# Patient Record
Sex: Male | Born: 1980 | Race: Black or African American | Hispanic: No | Smoking: Current some day smoker
Health system: Southern US, Community
[De-identification: ages and names within clinical notes are randomized; demographics above are authoritative.]

## PROBLEM LIST (undated history)

## (undated) ENCOUNTER — Ambulatory Visit (HOSPITAL_COMMUNITY): Admission: EM | Payer: Self-pay

## (undated) ENCOUNTER — Ambulatory Visit (HOSPITAL_COMMUNITY): Admission: EM | Payer: Self-pay | Source: Home / Self Care

## (undated) DIAGNOSIS — M419 Scoliosis, unspecified: Secondary | ICD-10-CM

---

## 2002-08-13 ENCOUNTER — Emergency Department (HOSPITAL_COMMUNITY): Admission: EM | Admit: 2002-08-13 | Discharge: 2002-08-13 | Payer: Self-pay | Admitting: Emergency Medicine

## 2002-08-13 ENCOUNTER — Encounter: Payer: Self-pay | Admitting: Emergency Medicine

## 2002-10-05 ENCOUNTER — Emergency Department (HOSPITAL_COMMUNITY): Admission: EM | Admit: 2002-10-05 | Discharge: 2002-10-05 | Payer: Self-pay | Admitting: Emergency Medicine

## 2002-12-16 ENCOUNTER — Emergency Department (HOSPITAL_COMMUNITY): Admission: EM | Admit: 2002-12-16 | Discharge: 2002-12-16 | Payer: Self-pay | Admitting: Emergency Medicine

## 2004-10-12 ENCOUNTER — Emergency Department (HOSPITAL_COMMUNITY): Admission: EM | Admit: 2004-10-12 | Discharge: 2004-10-13 | Payer: Self-pay | Admitting: Emergency Medicine

## 2005-02-12 ENCOUNTER — Emergency Department (HOSPITAL_COMMUNITY): Admission: EM | Admit: 2005-02-12 | Discharge: 2005-02-12 | Payer: Self-pay | Admitting: Emergency Medicine

## 2005-02-26 ENCOUNTER — Emergency Department (HOSPITAL_COMMUNITY): Admission: EM | Admit: 2005-02-26 | Discharge: 2005-02-26 | Payer: Self-pay | Admitting: Family Medicine

## 2005-09-11 ENCOUNTER — Emergency Department (HOSPITAL_COMMUNITY): Admission: EM | Admit: 2005-09-11 | Discharge: 2005-09-11 | Payer: Self-pay | Admitting: Emergency Medicine

## 2005-10-18 ENCOUNTER — Emergency Department (HOSPITAL_COMMUNITY): Admission: EM | Admit: 2005-10-18 | Discharge: 2005-10-18 | Payer: Self-pay | Admitting: Emergency Medicine

## 2006-07-29 ENCOUNTER — Emergency Department (HOSPITAL_COMMUNITY): Admission: EM | Admit: 2006-07-29 | Discharge: 2006-07-29 | Payer: Self-pay | Admitting: Emergency Medicine

## 2006-11-06 ENCOUNTER — Emergency Department (HOSPITAL_COMMUNITY): Admission: EM | Admit: 2006-11-06 | Discharge: 2006-11-06 | Payer: Self-pay | Admitting: Emergency Medicine

## 2006-11-12 ENCOUNTER — Emergency Department (HOSPITAL_COMMUNITY): Admission: EM | Admit: 2006-11-12 | Discharge: 2006-11-12 | Payer: Self-pay | Admitting: Family Medicine

## 2007-04-30 ENCOUNTER — Emergency Department (HOSPITAL_COMMUNITY): Admission: EM | Admit: 2007-04-30 | Discharge: 2007-04-30 | Payer: Self-pay | Admitting: Emergency Medicine

## 2008-06-20 ENCOUNTER — Emergency Department (HOSPITAL_COMMUNITY): Admission: EM | Admit: 2008-06-20 | Discharge: 2008-06-20 | Payer: Self-pay | Admitting: Family Medicine

## 2008-10-14 ENCOUNTER — Emergency Department (HOSPITAL_COMMUNITY): Admission: EM | Admit: 2008-10-14 | Discharge: 2008-10-14 | Payer: Self-pay | Admitting: Emergency Medicine

## 2010-07-22 LAB — CULTURE, ROUTINE-ABSCESS

## 2010-12-22 ENCOUNTER — Inpatient Hospital Stay (HOSPITAL_COMMUNITY)
Admission: RE | Admit: 2010-12-22 | Discharge: 2010-12-22 | Disposition: A | Discharge status: Home / Self Care | Payer: Self-pay | Source: Ambulatory Visit | Attending: Family Medicine | Admitting: Family Medicine

## 2010-12-31 LAB — URINALYSIS, ROUTINE W REFLEX MICROSCOPIC
Leukocytes, UA: NEGATIVE
Nitrite: NEGATIVE
Protein, ur: 30 — AB
Specific Gravity, Urine: 1.028
Urobilinogen, UA: 0.2

## 2010-12-31 LAB — BASIC METABOLIC PANEL
BUN: 11
CO2: 21
Calcium: 10.5
Creatinine, Ser: 1.17
GFR calc non Af Amer: >60
Glucose, Bld: 124 — ABNORMAL HIGH

## 2010-12-31 LAB — URINE MICROSCOPIC-ADD ON

## 2010-12-31 LAB — CBC
Hemoglobin: 16.9
MCHC: 34.2
Platelets: 265
RDW: 12.5

## 2010-12-31 LAB — DIFFERENTIAL
Basophils Absolute: 0
Basophils Relative: 0
Monocytes Absolute: 0.4
Neutro Abs: 7.7
Neutrophils Relative %: 92 — ABNORMAL HIGH

## 2011-11-19 ENCOUNTER — Emergency Department (HOSPITAL_COMMUNITY): Payer: Self-pay

## 2011-11-19 ENCOUNTER — Encounter (HOSPITAL_COMMUNITY): Payer: Self-pay

## 2011-11-19 ENCOUNTER — Emergency Department (HOSPITAL_COMMUNITY)
Admission: EM | Admit: 2011-11-19 | Discharge: 2011-11-19 | Disposition: A | Discharge status: Home / Self Care | Payer: Self-pay | Attending: Emergency Medicine | Admitting: Emergency Medicine

## 2011-11-19 DIAGNOSIS — S0280XA Fracture of other specified skull and facial bones, unspecified side, initial encounter for closed fracture: Secondary | ICD-10-CM | POA: Insufficient documentation

## 2011-11-19 DIAGNOSIS — Z23 Encounter for immunization: Secondary | ICD-10-CM | POA: Insufficient documentation

## 2011-11-19 DIAGNOSIS — F10929 Alcohol use, unspecified with intoxication, unspecified: Secondary | ICD-10-CM

## 2011-11-19 DIAGNOSIS — S199XXA Unspecified injury of neck, initial encounter: Secondary | ICD-10-CM | POA: Insufficient documentation

## 2011-11-19 DIAGNOSIS — S01119A Laceration without foreign body of unspecified eyelid and periocular area, initial encounter: Secondary | ICD-10-CM | POA: Insufficient documentation

## 2011-11-19 DIAGNOSIS — S02839A Fracture of medial orbital wall, unspecified side, initial encounter for closed fracture: Secondary | ICD-10-CM

## 2011-11-19 DIAGNOSIS — F1010 Alcohol abuse, uncomplicated: Secondary | ICD-10-CM | POA: Insufficient documentation

## 2011-11-19 DIAGNOSIS — S0993XA Unspecified injury of face, initial encounter: Secondary | ICD-10-CM | POA: Insufficient documentation

## 2011-11-19 DIAGNOSIS — M542 Cervicalgia: Secondary | ICD-10-CM | POA: Insufficient documentation

## 2011-11-19 LAB — CBC
Hemoglobin: 14.2 g/dL (ref 13.0–17.0)
MCH: 32.8 pg (ref 26.0–34.0)
MCHC: 34.1 g/dL (ref 30.0–36.0)
MCV: 96.3 fL (ref 78.0–100.0)
Platelets: 145 10*3/uL — ABNORMAL LOW (ref 150–400)
RBC: 4.33 MIL/uL (ref 4.22–5.81)

## 2011-11-19 LAB — COMPREHENSIVE METABOLIC PANEL
ALT: 106 U/L — ABNORMAL HIGH (ref 0–53)
AST: 120 U/L — ABNORMAL HIGH (ref 0–37)
Albumin: 4.4 g/dL (ref 3.5–5.2)
Chloride: 102 mEq/L (ref 96–112)
Creatinine, Ser: 0.76 mg/dL (ref 0.50–1.35)
Sodium: 138 mEq/L (ref 135–145)
Total Bilirubin: 0.2 mg/dL — ABNORMAL LOW (ref 0.3–1.2)

## 2011-11-19 LAB — SAMPLE TO BLOOD BANK

## 2011-11-19 LAB — POCT I-STAT, CHEM 8
Calcium, Ion: 1.06 mmol/L — ABNORMAL LOW (ref 1.12–1.23)
Glucose, Bld: 104 mg/dL — ABNORMAL HIGH (ref 70–99)
HCT: 47 % (ref 39.0–52.0)
Hemoglobin: 16 g/dL (ref 13.0–17.0)
TCO2: 21 mmol/L (ref 0–100)

## 2011-11-19 LAB — URINALYSIS, MICROSCOPIC ONLY
Bilirubin Urine: NEGATIVE
Glucose, UA: NEGATIVE mg/dL
Ketones, ur: NEGATIVE mg/dL
pH: 5.5 (ref 5.0–8.0)

## 2011-11-19 LAB — PROTIME-INR: INR: 1.04 (ref 0.00–1.49)

## 2011-11-19 LAB — LACTIC ACID, PLASMA: Lactic Acid, Venous: 1.5 mmol/L (ref 0.5–2.2)

## 2011-11-19 MED ORDER — IOHEXOL 300 MG/ML  SOLN
100.0000 mL | Freq: Once | INTRAMUSCULAR | Status: AC | PRN
  Administered 2011-11-19: 100 mL via INTRAVENOUS

## 2011-11-19 MED ORDER — CEPHALEXIN 500 MG PO CAPS: 500.0000 mg | ORAL_CAPSULE | Freq: Four times a day (QID) | ORAL | Status: AC

## 2011-11-19 MED ORDER — FENTANYL CITRATE 0.05 MG/ML IJ SOLN
100.0000 ug | Freq: Once | INTRAMUSCULAR | Status: AC
  Administered 2011-11-19: 100 ug via INTRAVENOUS
  Filled 2011-11-19: qty 2

## 2011-11-19 MED ORDER — ATROPINE SULFATE 1 % OP OINT
1.0000 "application " | TOPICAL_OINTMENT | OPHTHALMIC | Status: AC
  Administered 2011-11-19: 1 via OPHTHALMIC
  Filled 2011-11-19: qty 3.5

## 2011-11-19 MED ORDER — TOBRAMYCIN-DEXAMETHASONE 0.3-0.1 % OP OINT
TOPICAL_OINTMENT | Freq: Once | OPHTHALMIC | Status: AC
  Administered 2011-11-19: 12:00:00 via OPHTHALMIC
  Filled 2011-11-19: qty 3.5

## 2011-11-19 MED ORDER — IBUPROFEN 600 MG PO TABS: 600.0000 mg | ORAL_TABLET | Freq: Four times a day (QID) | ORAL | Status: AC | PRN

## 2011-11-19 MED ORDER — DEXAMETHASONE SODIUM PHOSPHATE 10 MG/ML IJ SOLN: 10.0000 mg | Freq: Once | INTRAMUSCULAR | Status: DC

## 2011-11-19 MED ORDER — PREDNISONE 10 MG PO TABS: 20.0000 mg | ORAL_TABLET | Freq: Every day | ORAL | Status: DC

## 2011-11-19 MED ORDER — ONDANSETRON HCL 4 MG/2ML IJ SOLN
4.0000 mg | Freq: Once | INTRAMUSCULAR | Status: AC
  Administered 2011-11-19: 4 mg via INTRAVENOUS
  Filled 2011-11-19: qty 2

## 2011-11-19 MED ORDER — FENTANYL CITRATE 0.05 MG/ML IJ SOLN
50.0000 ug | Freq: Once | INTRAMUSCULAR | Status: AC
  Administered 2011-11-19: 50 ug via INTRAVENOUS

## 2011-11-19 MED ORDER — FLUORESCEIN SODIUM 1 MG OP STRP
ORAL_STRIP | OPHTHALMIC | Status: AC
  Filled 2011-11-19: qty 2

## 2011-11-19 MED ORDER — FENTANYL CITRATE 0.05 MG/ML IJ SOLN
INTRAMUSCULAR | Status: AC
  Administered 2011-11-19: 50 ug via INTRAVENOUS
  Filled 2011-11-19: qty 2

## 2011-11-19 MED ORDER — TETRACAINE HCL 0.5 % OP SOLN
OPHTHALMIC | Status: AC
  Filled 2011-11-19: qty 2

## 2011-11-19 MED ORDER — HYDROCODONE-ACETAMINOPHEN 5-325 MG PO TABS: 1.0000 | ORAL_TABLET | Freq: Three times a day (TID) | ORAL | Status: AC | PRN

## 2011-11-19 MED ORDER — TETANUS-DIPHTH-ACELL PERTUSSIS 5-2.5-18.5 LF-MCG/0.5 IM SUSP
0.5000 mL | Freq: Once | INTRAMUSCULAR | Status: AC
  Administered 2011-11-19: 0.5 mL via INTRAMUSCULAR
  Filled 2011-11-19: qty 0.5

## 2011-11-19 MED ORDER — CEPHALEXIN 250 MG PO CAPS: 500.0000 mg | ORAL_CAPSULE | Freq: Once | ORAL | Status: DC
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
## 2011-11-19 NOTE — ED Notes (Signed)
Pt resting quietly with eyes closed, no s/s of any pain or distress noted. C-Collar remains in place, gauze dressing over left eye due to mild bleeding. Pt INAD, resp e/u and skin w/d, pt has mild increase of swelling and bruising to left eye, will continue to monitor pt.

## 2011-11-19 NOTE — ED Notes (Signed)
Patient currently talking with a Child psychotherapist about discharge.
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
## 2011-12-07 ENCOUNTER — Emergency Department (HOSPITAL_COMMUNITY): Payer: No Typology Code available for payment source

## 2011-12-07 ENCOUNTER — Emergency Department (HOSPITAL_COMMUNITY)
Admission: EM | Admit: 2011-12-07 | Discharge: 2011-12-07 | Disposition: A | Discharge status: Home / Self Care | Payer: No Typology Code available for payment source | Attending: Emergency Medicine | Admitting: Emergency Medicine

## 2011-12-07 DIAGNOSIS — S161XXA Strain of muscle, fascia and tendon at neck level, initial encounter: Secondary | ICD-10-CM

## 2011-12-07 DIAGNOSIS — R071 Chest pain on breathing: Secondary | ICD-10-CM | POA: Insufficient documentation

## 2011-12-07 DIAGNOSIS — R0789 Other chest pain: Secondary | ICD-10-CM

## 2011-12-07 DIAGNOSIS — R51 Headache: Secondary | ICD-10-CM | POA: Insufficient documentation

## 2011-12-07 DIAGNOSIS — Y92410 Unspecified street and highway as the place of occurrence of the external cause: Secondary | ICD-10-CM | POA: Insufficient documentation

## 2011-12-07 DIAGNOSIS — M542 Cervicalgia: Secondary | ICD-10-CM | POA: Insufficient documentation

## 2011-12-07 DIAGNOSIS — S139XXA Sprain of joints and ligaments of unspecified parts of neck, initial encounter: Secondary | ICD-10-CM | POA: Insufficient documentation

## 2011-12-07 MED ORDER — OXYCODONE-ACETAMINOPHEN 5-325 MG PO TABS
1.0000 | ORAL_TABLET | Freq: Once | ORAL | Status: AC
  Administered 2011-12-07: 1 via ORAL
  Filled 2011-12-07: qty 1

## 2011-12-07 MED ORDER — HYDROCODONE-ACETAMINOPHEN 5-325 MG PO TABS: 1.0000 | ORAL_TABLET | ORAL | Status: AC | PRN
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
## 2012-09-14 ENCOUNTER — Emergency Department (HOSPITAL_COMMUNITY)
Admission: EM | Admit: 2012-09-14 | Discharge: 2012-09-14 | Disposition: A | Discharge status: Home / Self Care | Payer: No Typology Code available for payment source | Attending: Emergency Medicine | Admitting: Emergency Medicine

## 2012-09-14 ENCOUNTER — Encounter (HOSPITAL_COMMUNITY): Payer: Self-pay | Admitting: *Deleted

## 2012-09-14 DIAGNOSIS — S40811A Abrasion of right upper arm, initial encounter: Secondary | ICD-10-CM

## 2012-09-14 DIAGNOSIS — S40812A Abrasion of left upper arm, initial encounter: Secondary | ICD-10-CM

## 2012-09-14 DIAGNOSIS — Y92410 Unspecified street and highway as the place of occurrence of the external cause: Secondary | ICD-10-CM | POA: Insufficient documentation

## 2012-09-14 DIAGNOSIS — IMO0002 Reserved for concepts with insufficient information to code with codable children: Secondary | ICD-10-CM | POA: Insufficient documentation

## 2012-09-14 DIAGNOSIS — Z8739 Personal history of other diseases of the musculoskeletal system and connective tissue: Secondary | ICD-10-CM | POA: Insufficient documentation

## 2012-09-14 DIAGNOSIS — Y9389 Activity, other specified: Secondary | ICD-10-CM | POA: Insufficient documentation

## 2012-09-14 HISTORY — DX: Scoliosis, unspecified: M41.9

## 2012-09-14 MED ORDER — IBUPROFEN 800 MG PO TABS: 800.0000 mg | ORAL_TABLET | Freq: Three times a day (TID) | ORAL | Status: DC
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
## 2012-09-15 NOTE — ED Provider Notes (Signed)
Medical screening examination/treatment/procedure(s) were performed by non-physician practitioner and as supervising physician I was immediately available for consultation/collaboration.    Vida Roller, MD 09/15/12 [phone]
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
## 2013-06-07 ENCOUNTER — Emergency Department (INDEPENDENT_AMBULATORY_CARE_PROVIDER_SITE_OTHER): Payer: Self-pay

## 2013-06-07 ENCOUNTER — Emergency Department (INDEPENDENT_AMBULATORY_CARE_PROVIDER_SITE_OTHER)
Admission: EM | Admit: 2013-06-07 | Discharge: 2013-06-07 | Disposition: A | Discharge status: Home / Self Care | Payer: Self-pay | Source: Home / Self Care | Attending: Family Medicine | Admitting: Family Medicine

## 2013-06-07 ENCOUNTER — Encounter (HOSPITAL_COMMUNITY): Payer: Self-pay | Admitting: Emergency Medicine

## 2013-06-07 DIAGNOSIS — W19XXXA Unspecified fall, initial encounter: Secondary | ICD-10-CM

## 2013-06-07 DIAGNOSIS — T148XXA Other injury of unspecified body region, initial encounter: Secondary | ICD-10-CM

## 2013-06-07 MED ORDER — DICLOFENAC SODIUM 75 MG PO TBEC: 75.0000 mg | DELAYED_RELEASE_TABLET | Freq: Two times a day (BID) | ORAL | Status: DC | PRN

## 2013-06-07 NOTE — ED Notes (Signed)
Reports slipping on black ice.  States later in the day fell forward down flight of stairs at apartment. And hitting head.   C/o severe right sided body and head pain.  Unsure if loss of consciousness.  States slept 24 hour period after incident.  Having mild sob.  Symptoms present x 3 days.   No relief with otc pain meds.

## 2013-06-07 NOTE — ED Provider Notes (Signed)
Kent BannisterBruno J Graham is a 33 y.o. male who presents to Urgent Care today for fall.  Patient had several falls in the past 2 days. Starting Wednesday he slipped on black ice landing on his right knee. He additionally tripped and fell down some stairs yesterday. He has pain in his right a.c. joint, right lateral ribs, and right lateral anterior knee. He notes pain with walking and knee bending. Additionally he notes his pain is worse with deep inspiration   Past Medical History  Diagnosis Date  . Scoliosis    History  Substance Use Topics  . Smoking status: Current Every Day Smoker -- 0.50 packs/day    Types: Cigarettes  . Smokeless tobacco: Not on file  . Alcohol Use: No   ROS as above Medications: No current facility-administered medications for this encounter.   Current Outpatient Prescriptions  Medication Sig Dispense Refill  . ibuprofen (ADVIL,MOTRIN) 800 MG tablet Take 1 tablet (800 mg total) by mouth 3 (three) times daily.  21 tablet  0    Exam:  BP 141/74  Pulse 78  Temp(Src) 98.1 F (36.7 C) (Oral)  Resp 20  SpO2 100% Gen: Well NAD HEENT: EOMI,  MMM Lungs: Normal work of breathing. CTABL Heart: RRR no MRG Abd: NABS, Soft. NT, ND Exts: Brisk capillary refill, warm and well perfused.  Right shoulder: Normal-appearing Painful but full range of motion. Tender palpation a.c. joint. Lateral ribs: Tender on right normal left Knee: Right knee normal-appearing full range of motion tender palpation anterior proximal tibia.  Stable ligamentous exam Normal range of motion.    Assessment and Plan: 33 y.o. male with fall with multiple contusions. Patient likely has a grade 1 a.c. shoulder separation as well as rib contusion and knee contusion. Plan to treat with diclofenac ice relative rest. Return in 2 weeks if not improved. Work note provided  Discussed warning signs or symptoms. Please see discharge instructions. Patient expresses understanding.    Rodolph BongEvan S Jazzmine Kleiman,  MD 06/07/13 [phone]

## 2013-06-07 NOTE — Discharge Instructions (Signed)
Thank you for coming in today. Take diclofenac twice daily as needed for severe pain. Call or go to the emergency room if you get worse, have trouble breathing, have chest pains, or palpitations.   Rib Contusion A rib contusion (bruise) can occur by a blow to the chest or by a fall against a hard object. Usually these will be much better in a couple weeks. If X-rays were taken today and there are no broken bones (fractures), the diagnosis of bruising is made. However, broken ribs may not show up for several days, or may be discovered later on a routine X-ray when signs of healing show up. If this happens to you, it does not mean that something was missed on the X-ray, but simply that it did not show up on the first X-rays. Earlier diagnosis will not usually change the treatment. HOME CARE INSTRUCTIONS   Avoid strenuous activity. Be careful during activities and avoid bumping the injured ribs. Activities that pull on the injured ribs and cause pain should be avoided, if possible.  For the first day or two, an ice pack used every 20 minutes while awake may be helpful. Put ice in a plastic bag and put a towel between the bag and the skin.  Eat a normal, well-balanced diet. Drink plenty of fluids to avoid constipation.  Take deep breaths several times a day to keep lungs free of infection. Try to cough several times a day. Splint the injured area with a pillow while coughing to ease pain. Coughing can help prevent pneumonia.  Wear a rib belt or binder only if told to do so by your caregiver. If you are wearing a rib belt or binder, you must do the breathing exercises as directed by your caregiver. If not used properly, rib belts or binders restrict breathing which can lead to pneumonia.  Only take over-the-counter or prescription medicines for pain, discomfort, or fever as directed by your caregiver. SEEK MEDICAL CARE IF:   You or your child has an oral temperature above 102 F (38.9 C).  Your  baby is older than 3 months with a rectal temperature of 100.5 F (38.1 C) or higher for more than 1 day.  You develop a cough, with thick or bloody sputum. SEEK IMMEDIATE MEDICAL CARE IF:   You have difficulty breathing.  You feel sick to your stomach (nausea), have vomiting or belly (abdominal) pain.  You have worsening pain, not controlled with medications, or there is a change in the location of the pain.  You develop sweating or radiation of the pain into the arms, jaw or shoulders, or become light headed or faint.  You or your child has an oral temperature above 102 F (38.9 C), not controlled by medicine.  Your or your baby is older than 3 months with a rectal temperature of 102 F (38.9 C) or higher.  Your baby is 87 months old or younger with a rectal temperature of 100.4 F (38 C) or higher. MAKE SURE YOU:   Understand these instructions.  Will watch your condition.  Will get help right away if you are not doing well or get worse. Document Released: 12/21/2000 Document Revised: 07/23/2012 Document Reviewed: 11/14/2007 [facility] Patient Information 2014 [place], [place].  Shoulder Separation You have a shoulder separation (acromio-clavicular separation). This occurs when an injury stretches or tears the ligaments that connect the top of the shoulder blade to the collar bone. Injury causes these bones to separate. A sling or splint may be  applied to limit your range of motion. HOME CARE INSTRUCTIONS   Apply ice to the injury for 15-20 minutes each hour while awake for the first 2 days. Put the ice in a plastic bag. Place a towel between the bag of ice and your skin.  Avoid activities that increase the pain.  Wear your sling or splint for as long as directed by your caregiver. If you remove the sling to dress or bathe, be sure your arm remains in the same position as when the sling is on. Do not lift your arm.  The splint must be tightened by another person every day.  Tighten it enough to keep the shoulders held back. Allow enough room to place the index finger between the body and strap. Loosen the splint immediately if you feel numbness or tingling in your hands.  Only take over-the-counter or prescription medicines for pain, discomfort, or fever as directed by your caregiver.  If this is a severe injury, you may need a referral to a specialist. It is very important to keep any follow-up appointments in order to avoid any type of permanent shoulder disability or chronic pain problems. SEEK MEDICAL CARE IF:  Pain or swelling to the injury increases and is not relieved with medications. SEEK IMMEDIATE MEDICAL CARE IF:  Your arm becomes numb, pale, cold, or there are abnormal feelings (sensations) shooting into your fingertips. Document Released: 01/05/2005 Document Revised: 06/20/2011 Document Reviewed: 11/07/2006 [facility] Patient Information 2014 [place].

## 2013-10-23 ENCOUNTER — Emergency Department (INDEPENDENT_AMBULATORY_CARE_PROVIDER_SITE_OTHER)
Admission: EM | Admit: 2013-10-23 | Discharge: 2013-10-23 | Disposition: A | Discharge status: Home / Self Care | Payer: Self-pay | Source: Home / Self Care | Attending: Family Medicine | Admitting: Family Medicine

## 2013-10-23 ENCOUNTER — Encounter (HOSPITAL_COMMUNITY): Payer: Self-pay | Admitting: Emergency Medicine

## 2013-10-23 DIAGNOSIS — M5431 Sciatica, right side: Secondary | ICD-10-CM

## 2013-10-23 DIAGNOSIS — M5430 Sciatica, unspecified side: Secondary | ICD-10-CM

## 2013-10-23 MED ORDER — KETOROLAC TROMETHAMINE 30 MG/ML IJ SOLN
INTRAMUSCULAR | Status: AC
  Filled 2013-10-23: qty 1

## 2013-10-23 MED ORDER — KETOROLAC TROMETHAMINE 30 MG/ML IJ SOLN
30.0000 mg | Freq: Once | INTRAMUSCULAR | Status: AC
  Administered 2013-10-23: 30 mg via INTRAMUSCULAR

## 2013-10-23 MED ORDER — INDOMETHACIN 25 MG PO CAPS: 25.0000 mg | ORAL_CAPSULE | Freq: Three times a day (TID) | ORAL | Status: DC | PRN

## 2013-10-23 NOTE — ED Provider Notes (Signed)
CSN: [id number]     Arrival date & time 10/23/13  1217 History   First MD Initiated Contact with Patient 10/23/13 1228     Chief Complaint  Patient presents with  . Back Pain   (Consider location/radiation/quality/duration/timing/severity/associated sxs/prior Treatment) HPI Comments: Denies recent injury Is a smoker No PCP Works as Location managermachine operator at [employer]  Patient is a 33 y.o. male presenting with back pain. The history is provided by the patient.  Back Pain Location:  Sacro-iliac joint Quality:  Aching and shooting Radiates to:  R thigh Pain severity:  Moderate Onset quality:  Gradual Duration:  3 weeks Chronicity:  New Ineffective treatments: reports limited relief with occasional use of ibuprofen. Associated symptoms: leg pain   Associated symptoms: no abdominal pain, no abdominal swelling, no bladder incontinence, no bowel incontinence, no chest pain, no dysuria, no fever, no headaches, no numbness, no paresthesias, no pelvic pain, no perianal numbness, no tingling, no weakness and no weight loss     Past Medical History  Diagnosis Date  . Scoliosis    History reviewed. No pertinent past surgical history. History reviewed. No pertinent family history. History  Substance Use Topics  . Smoking status: Current Every Day Smoker -- 0.50 packs/day    Types: Cigarettes  . Smokeless tobacco: Not on file  . Alcohol Use: No    Review of Systems  Constitutional: Negative for fever and weight loss.  Cardiovascular: Negative for chest pain.  Gastrointestinal: Negative for abdominal pain and bowel incontinence.  Genitourinary: Negative for bladder incontinence, dysuria and pelvic pain.  Musculoskeletal: Positive for back pain.  Neurological: Negative for tingling, weakness, numbness, headaches and paresthesias.  All other systems reviewed and are negative.   Allergies  Peanut butter flavor  Home Medications   Prior to Admission medications     Medication Sig Start Date End Date Taking? Authorizing Provider  diclofenac (VOLTAREN) 75 MG EC tablet Take 1 tablet (75 mg total) by mouth 2 (two) times daily as needed. 06/07/13   Rodolph BongEvan S Corey, MD  ibuprofen (ADVIL,MOTRIN) 800 MG tablet Take 1 tablet (800 mg total) by mouth 3 (three) times daily. 09/14/12   Junius FinnerErin O'Malley, PA-C  indomethacin (INDOCIN) 25 MG capsule Take 1 capsule (25 mg total) by mouth 3 (three) times daily as needed for mild pain or moderate pain. 10/23/13   Jess BartersJennifer Lee Presson, PA   BP 123/71  Resp 16  SpO2 100% Physical Exam  Nursing note and vitals reviewed. Constitutional: He is oriented to person, place, and time. He appears well-developed and well-nourished. No distress.  HENT:  Head: Normocephalic and atraumatic.  Eyes: Conjunctivae are normal. No scleral icterus.  Neck: Normal range of motion. Neck supple.  Cardiovascular: Normal rate, regular rhythm and normal heart sounds.   Pulmonary/Chest: Effort normal and breath sounds normal.  Musculoskeletal: Normal range of motion.       Lumbar back: He exhibits tenderness. He exhibits normal range of motion, no bony tenderness, no swelling, no edema, no deformity, no laceration, no spasm and normal pulse.       Back:  CSM exam of right and left lower extremities without deficit. Patellar DTRs 2+ bilaterally.   Neurological: He is alert and oriented to person, place, and time.  Skin: Skin is warm and dry. No rash noted. No erythema.  Psychiatric: He has a normal mood and affect. His behavior is normal.    ED Course  Procedures (including critical care time) Labs Review Labs Reviewed - No  data to display  Imaging Review No results found.   MDM   1. Sciatica neuralgia, right   No evidence of cauda equina syndrome and exam consistent with right sided sciatica. No associated GI or GU symptoms. Will treat with indocin as prescribed and advise follow up if no improvement.     Jess Barters [place], [place] 10/23/13  1330

## 2013-10-23 NOTE — Discharge Instructions (Signed)
Sciatica Sciatica is pain, weakness, numbness, or tingling along the path of the sciatic nerve. The nerve starts in the lower back and runs down the back of each leg. The nerve controls the muscles in the lower leg and in the back of the knee, while also providing sensation to the back of the thigh, lower leg, and the sole of your foot. Sciatica is a symptom of another medical condition. For instance, nerve damage or certain conditions, such as a herniated disk or bone spur on the spine, pinch or put pressure on the sciatic nerve. This causes the pain, weakness, or other sensations normally associated with sciatica. Generally, sciatica only affects one side of the body. CAUSES   Herniated or slipped disc.  Degenerative disk disease.  A pain disorder involving the narrow muscle in the buttocks (piriformis syndrome).  Pelvic injury or fracture.  Pregnancy.  Tumor (rare). SYMPTOMS  Symptoms can vary from mild to very severe. The symptoms usually travel from the low back to the buttocks and down the back of the leg. Symptoms can include:  Mild tingling or dull aches in the lower back, leg, or hip.  Numbness in the back of the calf or sole of the foot.  Burning sensations in the lower back, leg, or hip.  Sharp pains in the lower back, leg, or hip.  Leg weakness.  Severe back pain inhibiting movement. These symptoms may get worse with coughing, sneezing, laughing, or prolonged sitting or standing. Also, being overweight may worsen symptoms. DIAGNOSIS  Your caregiver will perform a physical exam to look for common symptoms of sciatica. He or she may ask you to do certain movements or activities that would trigger sciatic nerve pain. Other tests may be performed to find the cause of the sciatica. These may include:  Blood tests.  X-rays.  Imaging tests, such as an MRI or CT scan. TREATMENT  Treatment is directed at the cause of the sciatic pain. Sometimes, treatment is not necessary  and the pain and discomfort goes away on its own. If treatment is needed, your caregiver may suggest:  Over-the-counter medicines to relieve pain.  Prescription medicines, such as anti-inflammatory medicine, muscle relaxants, or narcotics.  Applying heat or ice to the painful area.  Steroid injections to lessen pain, irritation, and inflammation around the nerve.  Reducing activity during periods of pain.  Exercising and stretching to strengthen your abdomen and improve flexibility of your spine. Your caregiver may suggest losing weight if the extra weight makes the back pain worse.  Physical therapy.  Surgery to eliminate what is pressing or pinching the nerve, such as a bone spur or part of a herniated disk. HOME CARE INSTRUCTIONS   Only take over-the-counter or prescription medicines for pain or discomfort as directed by your caregiver.  Apply ice to the affected area for 20 minutes, 3-4 times a day for the first 48-72 hours. Then try heat in the same way.  Exercise, stretch, or perform your usual activities if these do not aggravate your pain.  Attend physical therapy sessions as directed by your caregiver.  Keep all follow-up appointments as directed by your caregiver.  Do not wear high heels or shoes that do not provide proper support.  Check your mattress to see if it is too soft. A firm mattress may lessen your pain and discomfort. SEEK IMMEDIATE MEDICAL CARE IF:   You lose control of your bowel or bladder (incontinence).  You have increasing weakness in the lower back, pelvis, buttocks,   or legs.  You have redness or swelling of your back.  You have a burning sensation when you urinate.  You have pain that gets worse when you lie down or awakens you at night.  Your pain is worse than you have experienced in the past.  Your pain is lasting longer than 4 weeks.  You are suddenly losing weight without reason. MAKE SURE YOU:  Understand these  instructions.  Will watch your condition.  Will get help right away if you are not doing well or get worse. Document Released: 03/22/2001 Document Revised: 09/27/2011 Document Reviewed: 08/07/2011 [employer] Patient Information 2015 [employer]. This information is not intended to replace advice given to you by your health care provider. Make sure you discuss any questions you have with your health care provider.  

## 2013-10-23 NOTE — ED Notes (Signed)
C/o 3 week duration of pain in low back, "pain is past 10"; NAD w/d/color good, calm conversant. Denies saddle anesthesia or loss of function

## 2013-10-24 NOTE — ED Provider Notes (Signed)
Medical screening examination/treatment/procedure(s) were performed by a resident physician or non-physician practitioner and as the supervising physician I was immediately available for consultation/collaboration.  Jerah Esty, MD    Eriverto Byrnes S Cari Vandeberg, MD 10/24/13 0742 

## 2018-12-25 ENCOUNTER — Ambulatory Visit (HOSPITAL_COMMUNITY)
Admission: EM | Admit: 2018-12-25 | Discharge: 2018-12-25 | Disposition: A | Discharge status: Home / Self Care | Payer: Self-pay | Attending: Emergency Medicine | Admitting: Emergency Medicine

## 2018-12-25 ENCOUNTER — Other Ambulatory Visit: Payer: Self-pay

## 2018-12-25 ENCOUNTER — Encounter (HOSPITAL_COMMUNITY): Payer: Self-pay | Admitting: Emergency Medicine

## 2018-12-25 DIAGNOSIS — Z20828 Contact with and (suspected) exposure to other viral communicable diseases: Secondary | ICD-10-CM | POA: Insufficient documentation

## 2018-12-25 DIAGNOSIS — R197 Diarrhea, unspecified: Secondary | ICD-10-CM

## 2018-12-25 DIAGNOSIS — F17210 Nicotine dependence, cigarettes, uncomplicated: Secondary | ICD-10-CM | POA: Insufficient documentation

## 2018-12-25 DIAGNOSIS — M545 Low back pain: Secondary | ICD-10-CM | POA: Insufficient documentation

## 2018-12-25 MED ORDER — IBUPROFEN 600 MG PO TABS: 600.0000 mg | ORAL_TABLET | Freq: Four times a day (QID) | ORAL | 0 refills | Status: DC | PRN

## 2018-12-25 MED ORDER — ONDANSETRON 8 MG PO TBDP: ORAL_TABLET | ORAL | 0 refills | Status: DC

## 2018-12-25 NOTE — Discharge Instructions (Signed)
Try the Zofran, and if that does not work, Imodium.  Push electrolyte containing fluids such as Pedialyte and Gatorade.  Your COVID test should be back in 1 to 2 days.  Come back here in several days if you are not better with the Zofran and Imodium, we can consider testing of a stool sample for viruses and bacteria.  Go immediately to the emergency department for fevers above 100.4, abdominal pain that changes or gets worse, back pain that gets worse, if you are unable to keep anything down, or for other concerns.  Follow-up with a primary care physician as soon as you possibly can for routine care.  Below is a list of primary care practices who are taking new patients for you to follow-up with.  [facility] at [facility] [address] [place], [place] [address] [phone]  [place], [place] [address] [phone]  [facility] [phone] [place] [address]  [facility]: [place] [employer]  [phone]  [place] [facility]: [place]   [phone]  [facility]: [address] [place]  [phone]  [place] [facility] : [address] [place] [phone]  [facility]: [address] [place] [address] [phone]  [facility]: [place] [phone]  Dr. Blanchie Serve [place]  [phone]  Dr. Benito Mccreedy, [facility]. [place], [place] [address]  [phone]  Go to www.goodrx.com to look up your medications. This will give  you a list of where you can find your prescriptions at the most affordable prices. Or ask the pharmacist what the cash price is, or if they have any other discount programs available to help make your medication more affordable. This can be less expensive than what you would pay with insurance.

## 2018-12-25 NOTE — ED Provider Notes (Signed)
HPI  SUBJECTIVE:  Kent Graham is a 38 y.o. male who presents with 3 days of multiple episodes of watery, nonbloody diarrhea.  He reports periumbilical abdominal pain and low back pain prior to having a bowel movement, both of which resolve after defecating.  He does not have abdominal or back pain at any other time.  The abdominal pain does not radiate, or migrate.  States that abdominal pain lasts anywhere from 3 to 5 minutes.  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  States that he tested negative several weeks ago.  He states that his symptoms started several hours after eating at church's fried chicken.  He states that the diarrhea is starting to slow down.  No raw or undercooked foods, questionable leftovers, recent travel, camping.  No antibiotics recently.  No contacts with diarrhea.  No abdominal distention, change in urine output, urinary complaints.  He has tried ginger ale, crackers, fasting.  Symptoms are better with fasting, having a bowel movement, worse with p.o. intake and before bowel movement.  He reports occasional alcohol use, denies heavy drinking.  No excess NSAID use.  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  No history of gallbladder disease, pancreatitis, diabetes, hypertension, diverticulitis, diverticulosis, abdominal surgeries, C. difficile infection.  PMD: None.    Past Medical History:  Diagnosis Date  . Scoliosis     History reviewed. No pertinent surgical history.  History reviewed. No pertinent family history.  Social History   Tobacco Use  . Smoking status: Current Every Day Smoker    Packs/day: 0.50    Types: Cigarettes  Substance Use Topics  . Alcohol use: No  . Drug use: No    No current  facility-administered medications for this encounter.   Current Outpatient Medications:  .  ibuprofen (ADVIL) 600 MG tablet, Take 1 tablet (600 mg total) by mouth every 6 (six) hours as needed., Disp: 30 tablet, Rfl: 0 .  ondansetron (ZOFRAN ODT) 8 MG disintegrating tablet, 1/2- 1 tablet q 8 hr prn nausea, vomiting, Disp: 20 tablet, Rfl: 0  Allergies  Allergen Reactions  . Peanut Butter Flavor      ROS  As noted in HPI.   Physical Exam  BP 115/81 (BP Location: Left Arm)   Pulse 90   Temp 98.1 F (36.7 C) (Oral)   Resp 18   SpO2 99%   Constitutional: Well developed, well nourished, no acute distress Eyes:  EOMI, conjunctiva normal bilaterally HENT: Normocephalic, atraumatic,mucus membranes moist Respiratory: Normal inspiratory effort Cardiovascular: Normal rate, regular rhythm, no murmurs rubs or gallops.  Cap refill less than 2 seconds. GI: Normal appearance, soft, nontender, nondistended, active bowel sounds.  No rebound, guarding.  No palpable masses. Back: No CVAT.  No L-spine tenderness.  Positive bilateral paralumbar tenderness.  No appreciable muscle spasm.  Mild tenderness at the SI joint.  No tenderness over the sacrum. skin: No rash, skin intact Musculoskeletal: no deformities Neurologic: Alert & oriented x 3, no focal neuro deficits Psychiatric: Speech and behavior appropriate   ED Course   Medications - No data to display  Orders Placed This Encounter  Procedures  . Novel Coronavirus, NAA (Hosp order, Send-out to Ref Lab; TAT 18-24 hrs    Standing Status:   Standing  Number of Occurrences:   1    Order Specific Question:   Is this test for diagnosis or screening    Answer:   Diagnosis of ill patient    Order Specific Question:   Symptomatic for COVID-19 as defined by CDC    Answer:   Yes    Order Specific Question:   Date of Symptom Onset    Answer:   12/23/2018    Order Specific Question:   Hospitalized for COVID-19    Answer:   No    Order  Specific Question:   Admitted to ICU for COVID-19    Answer:   No    Order Specific Question:   Previously tested for COVID-19    Answer:   Yes    Order Specific Question:   Resident in a congregate (group) care setting    Answer:   No    Order Specific Question:   Employed in healthcare setting    Answer:   No    No results found for this or any previous visit (from the past 24 hour(s)). No results found.  ED Clinical Impression  1. Diarrhea of presumed infectious origin      ED Assessment/Plan  Patient with presumed infectious diarrhea.  Will check for COVID.  Will write a  2 day work note.  Unable to identify any specific trigger for the diarrhea, so withholding empiric antibiotic treatment.  He appears well-hydrated, nontoxic, abdomen benign.  Will send home with Zofran, Imodium.  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  His back pain seems to be very musculoskeletal.  Home with ibuprofen/Tylenol 3-4 times a day as needed.  Will provide primary care list for ongoing care.  May return here in several days if he continues to have diarrhea,  consider doing stool sample at that time.  To the ER if he gets worse.  Discussed  MDM, treatment plan, and plan for follow-up with patient. Discussed sn/sx that should prompt return to the ED. patient agrees with plan.   Meds ordered this encounter  Medications  . ondansetron (ZOFRAN ODT) 8 MG disintegrating tablet    Sig: 1/2- 1 tablet q 8 hr prn nausea, vomiting    Dispense:  20 tablet    Refill:  0  . ibuprofen (ADVIL) 600 MG tablet    Sig: Take 1 tablet (600 mg total) by mouth every 6 (six) hours as needed.    Dispense:  30 tablet    Refill:  0    *This clinic note was created using Lobbyist. Therefore, there may be occasional mistakes despite careful proofreading.   ?    Melynda Ripple, MD 12/25/18 1357

## 2018-12-25 NOTE — ED Triage Notes (Signed)
Pt here for diarrhea x 2 days

## 2018-12-28 LAB — NOVEL CORONAVIRUS, NAA (HOSP ORDER, SEND-OUT TO REF LAB; TAT 18-24 HRS): SARS-CoV-2, NAA: NOT DETECTED

## 2019-11-06 ENCOUNTER — Encounter (HOSPITAL_COMMUNITY): Payer: Self-pay | Admitting: Emergency Medicine

## 2019-11-06 ENCOUNTER — Other Ambulatory Visit: Payer: Self-pay

## 2019-11-06 ENCOUNTER — Ambulatory Visit (HOSPITAL_COMMUNITY)
Admission: EM | Admit: 2019-11-06 | Discharge: 2019-11-06 | Disposition: A | Discharge status: Home / Self Care | Payer: Self-pay | Attending: Urgent Care | Admitting: Urgent Care

## 2019-11-06 DIAGNOSIS — M5442 Lumbago with sciatica, left side: Secondary | ICD-10-CM

## 2019-11-06 DIAGNOSIS — M5432 Sciatica, left side: Secondary | ICD-10-CM

## 2019-11-06 MED ORDER — METHYLPREDNISOLONE ACETATE 80 MG/ML IJ SUSP
80.0000 mg | Freq: Once | INTRAMUSCULAR | Status: AC
  Administered 2019-11-06: 80 mg via INTRAMUSCULAR

## 2019-11-06 MED ORDER — METHYLPREDNISOLONE ACETATE 80 MG/ML IJ SUSP
INTRAMUSCULAR | Status: AC
  Filled 2019-11-06: qty 1

## 2019-11-06 MED ORDER — MELOXICAM 7.5 MG PO TABS: 7.5000 mg | ORAL_TABLET | Freq: Every day | ORAL | 0 refills | Status: DC

## 2019-11-06 MED ORDER — TIZANIDINE HCL 4 MG PO TABS: 4.0000 mg | ORAL_TABLET | Freq: Three times a day (TID) | ORAL | 0 refills | Status: DC | PRN

## 2019-11-06 NOTE — Discharge Instructions (Addendum)
Start using meloxicam on Monday if you still have some back pain. My hope is that is better by then. It is ok to start using the muscle relaxant now but if it make you sleepy then just use it at bedtime.

## 2019-11-06 NOTE — ED Triage Notes (Signed)
Left, mid back down left leg .  Pain started 1 week ago.  Patient is getting worse and reports otc medicines not helping.  Patient has no known injury.  Patient has been using a heating pad and this has proven to be helpful

## 2019-11-06 NOTE — ED Provider Notes (Signed)
[facility]   MRN: [id number] DOB: Jun 20, 1980  Subjective:   Kent Graham is a 39 y.o. male presenting for 1 week history of recurrent left-sided low back pain that radiates all the way down to his leg and can cause numbness and tingling of the foot.  Has been using ibuprofen with minimal relief.  Has a history of sciatica and is previously responded very well to steroid injection.  Denies any recent falls, trauma, hematuria, dysuria.  Denies history of kidney stone.  Denies history of diabetes.  No current facility-administered medications for this encounter.  Current Outpatient Medications:  .  ibuprofen (ADVIL) 600 MG tablet, Take 1 tablet (600 mg total) by mouth every 6 (six) hours as needed., Disp: 30 tablet, Rfl: 0 .  ondansetron (ZOFRAN ODT) 8 MG disintegrating tablet, 1/2- 1 tablet q 8 hr prn nausea, vomiting, Disp: 20 tablet, Rfl: 0   Allergies  Allergen Reactions  . Peanut Butter Flavor     Past Medical History:  Diagnosis Date  . Scoliosis      History reviewed. No pertinent surgical history.  Family History  Problem Relation Age of Onset  . Healthy Father     Social History   Tobacco Use  . Smoking status: Current Every Day Smoker    Packs/day: 0.50    Types: Cigarettes  Substance Use Topics  . Alcohol use: Yes  . Drug use: Yes    Types: Marijuana    ROS   Objective:   Vitals: BP (!) 138/91 (BP Location: Left Arm)   Pulse 97   Temp 98.5 F (36.9 C) (Oral)   Resp 18   SpO2 97%   Physical Exam Constitutional:      General: He is not in acute distress.    Appearance: Normal appearance. He is well-developed and normal weight. He is not ill-appearing, toxic-appearing or diaphoretic.  HENT:     Head: Normocephalic and atraumatic.     Right Ear: External ear normal.     Left Ear: External ear normal.     Nose: Nose normal.     Mouth/Throat:     Pharynx: Oropharynx is clear.  Eyes:     General: No scleral icterus.       Right  eye: No discharge.        Left eye: No discharge.     Extraocular Movements: Extraocular movements intact.     Pupils: Pupils are equal, round, and reactive to light.  Cardiovascular:     Rate and Rhythm: Normal rate.  Pulmonary:     Effort: Pulmonary effort is normal.  Abdominal:     Tenderness: There is no right CVA tenderness or left CVA tenderness.  Musculoskeletal:     Cervical back: Normal range of motion.     Lumbar back: Tenderness (Over area outlined) present. No swelling, edema, deformity, signs of trauma, lacerations, spasms or bony tenderness. Decreased range of motion. Positive left straight leg raise test. Negative right straight leg raise test. No scoliosis.       Back:  Neurological:     Mental Status: He is alert and oriented to person, place, and time.     Motor: No weakness.     Deep Tendon Reflexes: Reflexes normal.  Psychiatric:        Mood and Affect: Mood normal.        Behavior: Behavior normal.        Thought Content: Thought content normal.  Judgment: Judgment normal.       Assessment and Plan :   PDMP not reviewed this encounter.  1. Acute left-sided low back pain with left-sided sciatica   2. Sciatica of left side     We will manage her sciatica with IM Depo-Medrol, meloxicam in a few days as needed.  He is okay to start tizanidine now.  Recommended continued hydration the way he is doing, counseled on back care.  If symptoms persist, recommended patient report to back specialist. Counseled patient on potential for adverse effects with medications prescribed/recommended today, ER and return-to-clinic precautions discussed, patient verbalized understanding.    Wallis Bamberg, [place] 11/06/19 1445

## 2020-03-06 ENCOUNTER — Other Ambulatory Visit: Payer: Self-pay

## 2020-03-06 ENCOUNTER — Ambulatory Visit (HOSPITAL_COMMUNITY)
Admission: EM | Admit: 2020-03-06 | Discharge: 2020-03-06 | Disposition: A | Discharge status: Home / Self Care | Payer: 59 | Attending: Family Medicine | Admitting: Family Medicine

## 2020-03-06 ENCOUNTER — Encounter (HOSPITAL_COMMUNITY): Payer: Self-pay

## 2020-03-06 DIAGNOSIS — M5442 Lumbago with sciatica, left side: Secondary | ICD-10-CM

## 2020-03-06 MED ORDER — TIZANIDINE HCL 4 MG PO TABS: 4.0000 mg | ORAL_TABLET | Freq: Three times a day (TID) | ORAL | 0 refills | Status: DC | PRN

## 2020-03-06 MED ORDER — MELOXICAM 7.5 MG PO TABS: 7.5000 mg | ORAL_TABLET | Freq: Every day | ORAL | 0 refills | Status: DC

## 2020-03-06 NOTE — ED Triage Notes (Signed)
Patient states he has recurrent back pain and has aggravated it 3 days ago and is in pain. Pt is aox4 and ambulates with pain.

## 2020-03-06 NOTE — Discharge Instructions (Addendum)
Mobic daily with food You may use tizanidine as needed to help with pain. This is a muscle relaxer and causes sedation- please use only at bedtime or when you will be home and not have to drive/work Gentle stretching, Avoid bed rest Follow up if not improving or worsening

## 2020-03-06 NOTE — ED Provider Notes (Signed)
[facility]    CSN: [id number] Arrival date & time: 03/06/20  6712      History   Chief Complaint Chief Complaint  Patient presents with  . Back Pain    lower back    HPI Kent Graham is a 39 y.o. male history of chronic back pain presenting today for evaluation of back pain.  Patient reports that he has had intermittent back pain over the past 2 years after remote work injury.  Patient reports over past 3 days pain has been flared up.  Denies any new injury.  Does occasionally do some heavy lifting.  Occasional radiation into legs.  Denies urinary symptoms.  Denies fevers.  Was seen here last in July for similar and reports improvement with Mobic and tizanidine.  Was also given IM Solu-Medrol at the time which did not change his symptoms.   HPI  Past Medical History:  Diagnosis Date  . Scoliosis     There are no problems to display for this patient.   History reviewed. No pertinent surgical history.     Home Medications    Prior to Admission medications   Medication Sig Start Date End Date Taking? Authorizing Provider  ibuprofen (ADVIL) 600 MG tablet Take 1 tablet (600 mg total) by mouth every 6 (six) hours as needed. 12/25/18   Domenick Gong, MD  meloxicam (MOBIC) 7.5 MG tablet Take 1 tablet (7.5 mg total) by mouth daily. 03/06/20   Jiraiya Mcewan C, PA-C  ondansetron (ZOFRAN ODT) 8 MG disintegrating tablet 1/2- 1 tablet q 8 hr prn nausea, vomiting 12/25/18   Domenick Gong, MD  tiZANidine (ZANAFLEX) 4 MG tablet Take 1 tablet (4 mg total) by mouth every 8 (eight) hours as needed. 03/06/20   Ayman Brull, Junius Creamer, PA-C    Family History Family History  Problem Relation Age of Onset  . Healthy Father     Social History Social History   Tobacco Use  . Smoking status: Current Every Day Smoker    Packs/day: 0.50    Types: Cigarettes  . Smokeless tobacco: Never Used  Vaping Use  . Vaping Use: Never used  Substance Use Topics  . Alcohol use:  Yes  . Drug use: Yes    Types: Marijuana     Allergies   Peanut butter flavor   Review of Systems Review of Systems  Constitutional: Negative for fatigue and fever.  Eyes: Negative for redness, itching and visual disturbance.  Respiratory: Negative for shortness of breath.   Cardiovascular: Negative for chest pain and leg swelling.  Gastrointestinal: Negative for nausea and vomiting.  Genitourinary: Negative for decreased urine volume, difficulty urinating and hematuria.  Musculoskeletal: Positive for back pain and myalgias. Negative for arthralgias.  Skin: Negative for color change, rash and wound.  Neurological: Negative for dizziness, syncope, weakness, light-headedness and headaches.     Physical Exam Triage Vital Signs ED Triage Vitals  Enc Vitals Group     BP 03/06/20 1642 (!) 128/100     Pulse Rate 03/06/20 1642 (!) 111     Resp 03/06/20 1642 18     Temp 03/06/20 1642 98.1 F (36.7 C)     Temp Source 03/06/20 1642 Oral     SpO2 03/06/20 1642 98 %     Weight --      Height --      Head Circumference --      Peak Flow --      Pain Score 03/06/20 1644 5  Pain Loc --      Pain Edu? --      Excl. in GC? --    No data found.  Updated Vital Signs BP (!) 128/100 (BP Location: Right Arm)   Pulse (!) 111   Temp 98.1 F (36.7 C) (Oral)   Resp 18   SpO2 98%   Visual Acuity Right Eye Distance:   Left Eye Distance:   Bilateral Distance:    Right Eye Near:   Left Eye Near:    Bilateral Near:     Physical Exam Vitals and nursing note reviewed.  Constitutional:      Appearance: He is well-developed.     Comments: No acute distress  HENT:     Head: Normocephalic and atraumatic.     Nose: Nose normal.  Eyes:     Conjunctiva/sclera: Conjunctivae normal.  Cardiovascular:     Rate and Rhythm: Normal rate.  Pulmonary:     Effort: Pulmonary effort is normal. No respiratory distress.  Abdominal:     General: There is no distension.  Musculoskeletal:          General: Normal range of motion.     Cervical back: Neck supple.     Comments: Ambulating with mild antalgia favoring left side  Diffusely tender throughout lumbar spine and bilateral lumbar musculature, no focal tenderness, no palpable deformity swelling or step-off midline  Strength at hips and knees 5/5 ankle bilaterally, pain is elicited with resisted hip flexion Patellar reflex 2+ bilaterally  Skin:    General: Skin is warm and dry.  Neurological:     Mental Status: He is alert and oriented to person, place, and time.      UC Treatments / Results  Labs (all labs ordered are listed, but only abnormal results are displayed) Labs Reviewed - No data to display  EKG   Radiology No results found.  Procedures Procedures (including critical care time)  Medications Ordered in UC Medications - No data to display  Initial Impression / Assessment and Plan / UC Course  I have reviewed the triage vital signs and the nursing notes.  Pertinent labs & imaging results that were available during my care of the patient were reviewed by me and considered in my medical decision making (see chart for details).     Acute on chronic back pain.  Will refill Mobic and tizanidine as this is helped him previously.  Patient wishes to not proceed with any further injections in clinic.  Discussed activity modification.  Provided sports medicine contact if symptoms continuing to be recurrent. No red flags.   Discussed strict return precautions. Patient verbalized understanding and is agreeable with plan.  Final Clinical Impressions(s) / UC Diagnoses   Final diagnoses:  Acute bilateral low back pain with left-sided sciatica     Discharge Instructions     Mobic daily with food You may use tizanidine as needed to help with pain. This is a muscle relaxer and causes sedation- please use only at bedtime or when you will be home and not have to drive/work Gentle stretching, Avoid bed  rest Follow up if not improving or worsening     ED Prescriptions    Medication Sig Dispense Auth. Provider   meloxicam (MOBIC) 7.5 MG tablet Take 1 tablet (7.5 mg total) by mouth daily. 30 tablet Emilyanne Mcgough C, PA-C   tiZANidine (ZANAFLEX) 4 MG tablet Take 1 tablet (4 mg total) by mouth every 8 (eight) hours as needed. 30 tablet [employer],  Ahmere Hemenway C, PA-C     I have reviewed the PDMP during this encounter.   Lew Dawes, [place] 03/06/20 1718

## 2020-04-10 ENCOUNTER — Ambulatory Visit (HOSPITAL_COMMUNITY)
Admission: EM | Admit: 2020-04-10 | Discharge: 2020-04-10 | Disposition: A | Discharge status: Home / Self Care | Payer: 59 | Attending: Emergency Medicine | Admitting: Emergency Medicine

## 2020-04-10 DIAGNOSIS — Z20822 Contact with and (suspected) exposure to covid-19: Secondary | ICD-10-CM | POA: Insufficient documentation

## 2020-04-10 DIAGNOSIS — Z01812 Encounter for preprocedural laboratory examination: Secondary | ICD-10-CM | POA: Diagnosis not present

## 2020-04-10 LAB — SARS CORONAVIRUS 2 (TAT 6-24 HRS): SARS Coronavirus 2: POSITIVE — AB

## 2020-04-10 NOTE — ED Triage Notes (Signed)
Pt states he needs COVID testing for employer.  Denies URI symptoms, fever, sore throat, n/v/d, loss of taste/smell or exposure.

## 2020-04-15 ENCOUNTER — Telehealth (HOSPITAL_COMMUNITY): Payer: Self-pay | Admitting: Emergency Medicine

## 2020-04-17 ENCOUNTER — Other Ambulatory Visit: Payer: 59

## 2020-04-20 ENCOUNTER — Other Ambulatory Visit: Payer: Self-pay

## 2020-04-20 ENCOUNTER — Other Ambulatory Visit: Payer: 59

## 2020-04-20 DIAGNOSIS — Z20822 Contact with and (suspected) exposure to covid-19: Secondary | ICD-10-CM

## 2020-04-23 LAB — NOVEL CORONAVIRUS, NAA: SARS-CoV-2, NAA: NOT DETECTED

## 2020-07-07 ENCOUNTER — Other Ambulatory Visit: Payer: Self-pay

## 2020-07-07 ENCOUNTER — Ambulatory Visit (HOSPITAL_COMMUNITY): Admission: EM | Admit: 2020-07-07 | Discharge: 2020-07-07 | Discharge status: Against Medical Advice | Payer: Self-pay

## 2020-07-08 ENCOUNTER — Ambulatory Visit (HOSPITAL_COMMUNITY): Payer: Self-pay

## 2020-10-22 ENCOUNTER — Encounter (HOSPITAL_COMMUNITY): Payer: Self-pay | Admitting: Emergency Medicine

## 2020-10-22 ENCOUNTER — Ambulatory Visit (HOSPITAL_COMMUNITY)
Admission: EM | Admit: 2020-10-22 | Discharge: 2020-10-22 | Disposition: A | Discharge status: Home / Self Care | Payer: Self-pay | Attending: Emergency Medicine | Admitting: Emergency Medicine

## 2020-10-22 ENCOUNTER — Other Ambulatory Visit: Payer: Self-pay

## 2020-10-22 DIAGNOSIS — G44319 Acute post-traumatic headache, not intractable: Secondary | ICD-10-CM

## 2020-10-22 DIAGNOSIS — S161XXA Strain of muscle, fascia and tendon at neck level, initial encounter: Secondary | ICD-10-CM

## 2020-10-22 DIAGNOSIS — S39012A Strain of muscle, fascia and tendon of lower back, initial encounter: Secondary | ICD-10-CM

## 2020-10-22 MED ORDER — TIZANIDINE HCL 4 MG PO TABS: 4.0000 mg | ORAL_TABLET | Freq: Three times a day (TID) | ORAL | 0 refills | Status: DC | PRN

## 2020-10-22 MED ORDER — IBUPROFEN 600 MG PO TABS: 600.0000 mg | ORAL_TABLET | Freq: Four times a day (QID) | ORAL | 0 refills | Status: DC | PRN
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
## 2020-10-22 NOTE — Discharge Instructions (Addendum)
People tend to feel worse over the next several days, but most people are back to normal in 1 week. A small number of people will have persistent pain for up to six weeks. Take the 600 mg of ibuprofen combined with 1000 mg of tylenol 4 times a day. This is an extremely effective combination for pain.  Zanaflex is for muscle spasms.  Epsom salt baths.   Below is a list of primary care practices who are taking new patients for you to follow-up with.  [facility] internal medicine clinic Ground Floor - [facility], [address] [place], [place], [place] [address] [phone]  [facility] at [facility] [address] [place], [place] [address] [phone]  [facility] [address] [place], [place] [address] [phone]  [facility] [phone] [address] [place] [address]  [facility]: [address] [place] [address]  [phone]  [facility]: [address] [place] [address]  [phone]  [facility] : [address] [place] [address] [phone]  [facility]: [address] [place] [address] [phone]  [facility]: [address] [place] [address] [phone]  Dr. Oneal Grout [address] [facility] [place] [address]  [phone]  Go to www.goodrx.com  or www.costplusdrugs.com to look up your medications. This will give you a list of where you can find your prescriptions at the most affordable prices. Or ask the pharmacist what the cash price is, or if they have any other discount programs available to help make your medication more affordable. This can be less expensive than what you would pay with insurance.
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
## 2020-11-12 ENCOUNTER — Ambulatory Visit (HOSPITAL_COMMUNITY)
Admission: EM | Admit: 2020-11-12 | Discharge: 2020-11-12 | Disposition: A | Discharge status: Home / Self Care | Payer: Self-pay | Attending: Family Medicine | Admitting: Family Medicine

## 2020-11-12 ENCOUNTER — Other Ambulatory Visit: Payer: Self-pay

## 2020-11-12 ENCOUNTER — Ambulatory Visit (INDEPENDENT_AMBULATORY_CARE_PROVIDER_SITE_OTHER): Payer: Self-pay

## 2020-11-12 ENCOUNTER — Encounter (HOSPITAL_COMMUNITY): Payer: Self-pay

## 2020-11-12 DIAGNOSIS — M25512 Pain in left shoulder: Secondary | ICD-10-CM

## 2020-11-12 DIAGNOSIS — M549 Dorsalgia, unspecified: Secondary | ICD-10-CM

## 2020-11-12 MED ORDER — DICLOFENAC SODIUM 75 MG PO TBEC: 75.0000 mg | DELAYED_RELEASE_TABLET | Freq: Two times a day (BID) | ORAL | 0 refills | Status: DC
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
## 2020-11-13 ENCOUNTER — Ambulatory Visit (HOSPITAL_COMMUNITY)
Admission: EM | Admit: 2020-11-13 | Discharge: 2020-11-13 | Disposition: A | Discharge status: Home / Self Care | Payer: Self-pay

## 2020-11-13 ENCOUNTER — Encounter (HOSPITAL_COMMUNITY): Payer: Self-pay | Admitting: *Deleted

## 2020-11-13 ENCOUNTER — Other Ambulatory Visit: Payer: Self-pay

## 2020-11-13 ENCOUNTER — Encounter: Payer: Self-pay | Admitting: *Deleted

## 2020-11-13 ENCOUNTER — Telehealth (HOSPITAL_COMMUNITY): Payer: Self-pay

## 2020-11-13 DIAGNOSIS — M25512 Pain in left shoulder: Secondary | ICD-10-CM
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
## 2020-11-25 ENCOUNTER — Other Ambulatory Visit: Payer: Self-pay | Admitting: Student

## 2020-11-25 ENCOUNTER — Ambulatory Visit (INDEPENDENT_AMBULATORY_CARE_PROVIDER_SITE_OTHER): Payer: Self-pay | Admitting: Student

## 2020-11-25 VITALS — BP 106/68 | HR 81 | Temp 98.3°F | Wt 172.1 lb

## 2020-11-25 DIAGNOSIS — T148XXA Other injury of unspecified body region, initial encounter: Secondary | ICD-10-CM

## 2020-11-25 DIAGNOSIS — Z720 Tobacco use: Secondary | ICD-10-CM

## 2020-11-25 MED ORDER — CYCLOBENZAPRINE HCL 5 MG PO TABS: 5.0000 mg | ORAL_TABLET | Freq: Two times a day (BID) | ORAL | 0 refills | Status: DC | PRN

## 2020-11-25 NOTE — Patient Instructions (Signed)
It was a pleasure meeting you today.   Regarding your shoulder pain we have given you a prescription for your muscle pain. Please continue to use ibuprofen as needed and heating pad.

## 2020-11-26 ENCOUNTER — Encounter: Payer: Self-pay | Admitting: Student

## 2020-11-26 DIAGNOSIS — Z720 Tobacco use: Secondary | ICD-10-CM | POA: Insufficient documentation

## 2020-11-26 DIAGNOSIS — T148XXA Other injury of unspecified body region, initial encounter: Secondary | ICD-10-CM | POA: Insufficient documentation

## 2020-11-26 NOTE — Telephone Encounter (Signed)
Problem with MDs state license #.  Will send to attending to resend rx.Kingsley Spittle Cassady8/18/202210:16 AM

## 2020-11-26 NOTE — Assessment & Plan Note (Signed)
Patient reports he is ready to quit smoking and will continue to try and decrease his cigarette use. He is not interested in gum or medications at this time.  -Will continue to revisit this at future visits.

## 2020-11-26 NOTE — Progress Notes (Signed)
   CC: Establish with PCP  HPI:  [name] is a 40 y.o. M with PMH below.   Please see assessment and plan under notes tab for further details.    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 States he gets ~37k steps per day.  Smokes 1 pack every other day for the past 23 years Drinks 2 beers per day though he notes they are sometimes larger than a regular beer. Does not feel he requires a drink to start his day. His family members do not complain about his drinking. He does not feel guilty about his drinking.   Review of Systems: Please see assessment and plan under notes tab for further details.    Physical Exam:  Vitals:   11/25/20 1530  BP: 106/68  Pulse: 81  Temp: 98.3 F (36.8 C)  TempSrc: Oral  SpO2: 98%  Weight: 172 lb 1.6 oz (78.1 kg)   Gen: No acute distress CV: RRR, no murmurs  Pulm: Non labored breathing on RA, no wheezing or crackles Abd: Soft, NT, ND Ext: No edema of BLE  MSK: TTP of posterior trapezius lateral aspect, overlying well healed sca, no eccymosis  Assessment & Plan:   See Encounters Tab for problem based charting.  Patient discussed with Dr. Mayford Knife
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
## 2020-12-08 NOTE — Progress Notes (Signed)
Internal Medicine Clinic Attending  Case discussed with Dr. Carter  At the time of the visit.  We reviewed the resident's history and exam and pertinent patient test results.  I agree with the assessment, diagnosis, and plan of care documented in the resident's note.  

## 2021-01-07 ENCOUNTER — Ambulatory Visit (HOSPITAL_COMMUNITY): Payer: Self-pay

## 2021-02-09 ENCOUNTER — Ambulatory Visit (HOSPITAL_COMMUNITY): Payer: Self-pay

## 2021-02-11 ENCOUNTER — Other Ambulatory Visit: Payer: Self-pay

## 2021-02-11 ENCOUNTER — Encounter (HOSPITAL_COMMUNITY): Payer: Self-pay | Admitting: Emergency Medicine

## 2021-02-11 ENCOUNTER — Ambulatory Visit (HOSPITAL_COMMUNITY)
Admission: EM | Admit: 2021-02-11 | Discharge: 2021-02-11 | Disposition: A | Discharge status: Home / Self Care | Payer: Self-pay | Attending: Emergency Medicine | Admitting: Emergency Medicine

## 2021-02-11 DIAGNOSIS — R051 Acute cough: Secondary | ICD-10-CM

## 2021-02-11 LAB — POC INFLUENZA A AND B ANTIGEN (URGENT CARE ONLY)
INFLUENZA A ANTIGEN, POC: NEGATIVE
INFLUENZA B ANTIGEN, POC: NEGATIVE

## 2021-02-11 NOTE — Discharge Instructions (Signed)

## 2021-02-11 NOTE — ED Provider Notes (Signed)
[facility]    CSN: [id number] Arrival date & time: 02/11/21  1720      History   Chief Complaint Chief Complaint  Patient presents with   Cough   Shortness of Breath   Generalized Body Aches   Headache    HPI Kent Graham is a 40 y.o. male.   For evaluation of cough, shortness of breath, generalized body aches, and headache that has been ongoing since Saturday.  Reports getting weekly COVID tests that have all been negative at work.  Reports having a fever on Friday but denies any other fevers at home.  Reports taking OTC medication with minimal symptom relief.  Denies any known sick contacts but patient does work with the public. Denies any trauma, injury, or other precipitating event.  Denies any specific alleviating or aggravating factors.  Denies any chest pain, N/V/D, numbness, tingling, weakness, abdominal pain.    The history is provided by the patient.  Cough Associated symptoms: headaches, myalgias and shortness of breath   Shortness of Breath Associated symptoms: cough and headaches   Headache Associated symptoms: cough, fatigue and myalgias    Past Medical History:  Diagnosis Date   Scoliosis     Patient Active Problem List   Diagnosis Date Noted   Tobacco use 11/26/2020   Muscle strain 11/26/2020    History reviewed. No pertinent surgical history.     Home Medications    Prior to Admission medications   Medication Sig Start Date End Date Taking? Authorizing Provider  cyclobenzaprine (FLEXERIL) 5 MG tablet TAKE 1 TABLET BY MOUTH 2 TIMES DAILY AS NEEDED FOR MUSCLE SPASMS. 11/26/20   Earl Lagos, MD  diclofenac (VOLTAREN) 75 MG EC tablet Take 1 tablet (75 mg total) by mouth 2 (two) times daily. 11/12/20   Mardella Layman, MD    Family History Family History  Problem Relation Age of Onset   Healthy Father     Social History Social History   Tobacco Use   Smoking status: Every Day    Packs/day: 0.50    Types: Cigarettes    Smokeless tobacco: Never  Vaping Use   Vaping Use: Never used  Substance Use Topics   Alcohol use: Yes   Drug use: Not Currently    Types: Marijuana     Allergies   Patient has no known allergies.   Review of Systems Review of Systems  Constitutional:  Positive for fatigue.  Respiratory:  Positive for cough and shortness of breath.   Musculoskeletal:  Positive for myalgias.  Neurological:  Positive for headaches.  All other systems reviewed and are negative.   Physical Exam Triage Vital Signs ED Triage Vitals  Enc Vitals Group     BP 02/11/21 1841 (!) 152/93     Pulse Rate 02/11/21 1841 84     Resp 02/11/21 1841 18     Temp 02/11/21 1841 98.8 F (37.1 C)     Temp src --      SpO2 02/11/21 1841 95 %     Weight --      Height --      Head Circumference --      Peak Flow --      Pain Score 02/11/21 1839 9     Pain Loc --      Pain Edu? --      Excl. in GC? --    No data found.  Updated Vital Signs BP (!) 152/93   Pulse 84   Temp  98.8 F (37.1 C)   Resp 18   SpO2 95%   Visual Acuity Right Eye Distance:   Left Eye Distance:   Bilateral Distance:    Right Eye Near:   Left Eye Near:    Bilateral Near:     Physical Exam Vitals and nursing note reviewed.  Constitutional:      General: He is not in acute distress.    Appearance: Normal appearance. He is not ill-appearing, toxic-appearing or diaphoretic.  HENT:     Head: Normocephalic and atraumatic.     Nose: Congestion present. No rhinorrhea.     Mouth/Throat:     Pharynx: Posterior oropharyngeal erythema present.  Eyes:     Conjunctiva/sclera: Conjunctivae normal.  Cardiovascular:     Rate and Rhythm: Normal rate and regular rhythm.     Pulses: Normal pulses.     Heart sounds: Normal heart sounds.  Pulmonary:     Effort: Pulmonary effort is normal.     Breath sounds: Normal breath sounds.  Abdominal:     General: Abdomen is flat.  Musculoskeletal:        General: Normal range of motion.      Cervical back: Normal range of motion.  Skin:    General: Skin is warm and dry.  Neurological:     General: No focal deficit present.     Mental Status: He is alert and oriented to person, place, and time.     GCS: GCS eye subscore is 4. GCS verbal subscore is 5. GCS motor subscore is 6.     Cranial Nerves: No dysarthria or facial asymmetry.  Psychiatric:        Mood and Affect: Mood normal.     UC Treatments / Results  Labs (all labs ordered are listed, but only abnormal results are displayed) Labs Reviewed  POC INFLUENZA A AND B ANTIGEN (URGENT CARE ONLY)    EKG   Radiology No results found.  Procedures Procedures (including critical care time)  Medications Ordered in UC Medications - No data to display  Initial Impression / Assessment and Plan / UC Course  I have reviewed the triage vital signs and the nursing notes.  Pertinent labs & imaging results that were available during my care of the patient were reviewed by me and considered in my medical decision making (see chart for details).    Assessment negative for red flags or concerns.  Flu swab negative.  Viral illness versus seasonal allergies.  Tylenol and or ibuprofen as needed.  Encourage fluids and rest.  Discussed conservative symptom management as described in discharge instructions.  Follow-up as needed. Final Clinical Impressions(s) / UC Diagnoses   Final diagnoses:  Acute cough     Discharge Instructions      You can take Tylenol and/or Ibuprofen as needed for fever reduction and pain relief.    For cough: honey 1/2 to 1 teaspoon (you can dilute the honey in water or another fluid).  You can also use guaifenesin and dextromethorphan for cough. You can use a humidifier for chest congestion and cough.  If you don't have a humidifier, you can sit in the bathroom with the hot shower running.     For sore throat: try warm salt water gargles, cepacol lozenges, throat spray, warm tea or water with  lemon/honey, popsicles or ice, or OTC cold relief medicine for throat discomfort.    For congestion: take a daily anti-histamine like Zyrtec, Claritin, and a oral decongestant, such as pseudoephedrine.  You can also use Flonase 1-2 sprays in each nostril daily.    It is important to stay hydrated: drink plenty of fluids (water, gatorade/powerade/pedialyte, juices, or teas) to keep your throat moisturized and help further relieve irritation/discomfort.   Return or go to the Emergency Department if symptoms worsen or do not improve in the next few days.      ED Prescriptions   None    PDMP not reviewed this encounter.   Ivette Loyal, NP 02/11/21 1943

## 2021-02-11 NOTE — ED Triage Notes (Signed)
Pt is present today with body aches, SOB, HA, cough, and chest congestion. Pt states sx started Saturday

## 2021-04-24 ENCOUNTER — Other Ambulatory Visit: Payer: Self-pay

## 2021-04-24 ENCOUNTER — Encounter (HOSPITAL_COMMUNITY): Payer: Self-pay

## 2021-04-24 ENCOUNTER — Ambulatory Visit (HOSPITAL_COMMUNITY)
Admission: EM | Admit: 2021-04-24 | Discharge: 2021-04-24 | Disposition: A | Discharge status: Home / Self Care | Payer: Self-pay | Attending: Family Medicine | Admitting: Family Medicine

## 2021-04-24 DIAGNOSIS — K0889 Other specified disorders of teeth and supporting structures: Secondary | ICD-10-CM

## 2021-04-24 MED ORDER — AMOXICILLIN 875 MG PO TABS: 875.0000 mg | ORAL_TABLET | Freq: Two times a day (BID) | ORAL | 0 refills | Status: AC

## 2021-04-24 MED ORDER — HYDROCODONE-ACETAMINOPHEN 5-325 MG PO TABS: 1.0000 | ORAL_TABLET | Freq: Four times a day (QID) | ORAL | 0 refills | Status: DC | PRN

## 2021-04-24 NOTE — Discharge Instructions (Signed)

## 2021-04-24 NOTE — ED Triage Notes (Signed)
Pt presents with c/o jaw pain, migraine, sore throat x4 days.   States he has taken Ibuprofen for pain.   States the wind causes pain.

## 2021-04-26 NOTE — ED Provider Notes (Signed)
°  [facility]   [id number] 04/24/21 Arrival Time: 1211  ASSESSMENT & PLAN:  1. Pain, dental    No sign of abscess requiring I&D at this time. Discussed.  Begin: Meds ordered this encounter  Medications   amoxicillin (AMOXIL) 875 MG tablet    Sig: Take 1 tablet (875 mg total) by mouth 2 (two) times daily for 10 days.    Dispense:  20 tablet    Refill:  0   HYDROcodone-acetaminophen (NORCO/VICODIN) 5-325 MG tablet    Sig: Take 1 tablet by mouth every 6 (six) hours as needed for moderate pain or severe pain.    Dispense:  8 tablet    Refill:  0    [place] Controlled Substances Registry consulted for this patient. I feel the risk/benefit ratio today is favorable for proceeding with this prescription for a controlled substance. Medication sedation precautions given.  Dental resource written instructions given. He will schedule dental evaluation as soon as possible if not improving over the next 24-48 hours.  Reviewed expectations re: course of current medical issues. Questions answered. Outlined signs and symptoms indicating need for more acute intervention. Patient verbalized understanding. After Visit Summary given.   SUBJECTIVE:  Kent Graham is a 41 y.o. male who reports gradual onset of right lower dental pain described as aching/throbbing. Present for 3-4 days days. Fever: absent. Tolerating PO intake but reports pain with chewing. Normal swallowing. He does not see a dentist regularly. No neck swelling or pain. OTC analgesics without relief.   OBJECTIVE: Vitals:   04/24/21 1327  BP: 126/86  Pulse: 90  Resp: 18  Temp: 98 F (36.7 C)  TempSrc: Oral  SpO2: 96%    General appearance: alert; no distress HENT: normocephalic; atraumatic; dentition: poor; right lower gum without areas of fluctuance, drainage, or bleeding and with tenderness to palpation; normal jaw movement without difficulty Neck: supple without LAD; FROM; trachea midline Lungs: normal  respirations; unlabored; speaks full sentences without difficulty Skin: warm and dry Psychological: alert and cooperative; normal mood and affect  No Known Allergies  Past Medical History:  Diagnosis Date   Scoliosis    Social History   Socioeconomic History   Marital status: Significant Other    Spouse name: Not on file   Number of children: Not on file   Years of education: Not on file   Highest education level: Not on file  Occupational History   Not on file  Tobacco Use   Smoking status: Every Day    Packs/day: 0.50    Types: Cigarettes   Smokeless tobacco: Never  Vaping Use   Vaping Use: Never used  Substance and Sexual Activity   Alcohol use: Yes   Drug use: Not Currently    Types: Marijuana   Sexual activity: Yes  Other Topics Concern   Not on file  Social History Narrative   Not on file   Social Determinants of Health   Financial Resource Strain: Not on file  Food Insecurity: Not on file  Transportation Needs: Not on file  Physical Activity: Not on file  Stress: Not on file  Social Connections: Not on file  Intimate Partner Violence: Not on file   Family History  Problem Relation Age of Onset   Healthy Father    History reviewed. No pertinent surgical history.    Mardella Layman, MD 04/26/21 [phone]

## 2021-07-16 ENCOUNTER — Other Ambulatory Visit: Payer: Self-pay

## 2021-07-16 ENCOUNTER — Encounter (HOSPITAL_COMMUNITY): Payer: Self-pay | Admitting: Emergency Medicine

## 2021-07-16 ENCOUNTER — Ambulatory Visit (HOSPITAL_COMMUNITY)
Admission: EM | Admit: 2021-07-16 | Discharge: 2021-07-16 | Disposition: A | Discharge status: Home / Self Care | Payer: Self-pay | Attending: Physician Assistant | Admitting: Physician Assistant

## 2021-07-16 DIAGNOSIS — M109 Gout, unspecified: Secondary | ICD-10-CM

## 2021-07-16 MED ORDER — INDOMETHACIN 50 MG PO CAPS: 50.0000 mg | ORAL_CAPSULE | Freq: Three times a day (TID) | ORAL | 0 refills | Status: DC

## 2021-07-16 NOTE — ED Provider Notes (Signed)
?[facility] ? ? ? ?CSN: [id number] ?Arrival date & time: 07/16/21  1329 ? ? ?  ? ?History   ?Chief Complaint ?Chief Complaint  ?Patient presents with  ? Foot Pain  ? ? ?HPI ?Kent Graham is a 41 y.o. male.  ? ?Pt reports he has swelling and pain in his right 1st toe at the base.  Pt reports area was very painful earlier.  Pt reports feeling some better now.  ? ?The history is provided by the patient.  ?Foot Pain ?This is a new problem. Nothing aggravates the symptoms. Nothing relieves the symptoms.  ? ?Past Medical History:  ?Diagnosis Date  ? Scoliosis   ? ? ?Patient Active Problem List  ? Diagnosis Date Noted  ? Tobacco use 11/26/2020  ? Muscle strain 11/26/2020  ? ? ?History reviewed. No pertinent surgical history. ? ? ? ? ?Home Medications   ? ?Prior to Admission medications   ?Medication Sig Start Date End Date Taking? Authorizing Provider  ?indomethacin (INDOCIN) 50 MG capsule Take 1 capsule (50 mg total) by mouth 3 (three) times daily with meals. 07/16/21  Yes Cheron Schaumann K, PA-C  ?cyclobenzaprine (FLEXERIL) 5 MG tablet TAKE 1 TABLET BY MOUTH 2 TIMES DAILY AS NEEDED FOR MUSCLE SPASMS. 11/26/20   Earl Lagos, MD  ?diclofenac (VOLTAREN) 75 MG EC tablet Take 1 tablet (75 mg total) by mouth 2 (two) times daily. 11/12/20   Mardella Layman, MD  ?HYDROcodone-acetaminophen (NORCO/VICODIN) 5-325 MG tablet Take 1 tablet by mouth every 6 (six) hours as needed for moderate pain or severe pain. 04/24/21   Mardella Layman, MD  ? ? ?Family History ?Family History  ?Problem Relation Age of Onset  ? Healthy Father   ? ? ?Social History ?Social History  ? ?Tobacco Use  ? Smoking status: Every Day  ?  Packs/day: 0.50  ?  Types: Cigarettes  ? Smokeless tobacco: Never  ?Vaping Use  ? Vaping Use: Never used  ?Substance Use Topics  ? Alcohol use: Yes  ? Drug use: Not Currently  ?  Types: Marijuana  ? ? ? ?Allergies   ?Patient has no known allergies. ? ? ?Review of Systems ?Review of Systems  ?All other systems reviewed  and are negative. ? ? ?Physical Exam ?Triage Vital Signs ?ED Triage Vitals  ?Enc Vitals Group  ?   BP 07/16/21 1458 (!) 122/96  ?   Pulse Rate 07/16/21 1458 (!) 107  ?   Resp 07/16/21 1458 20  ?   Temp 07/16/21 1458 (!) 97.4 ?F (36.3 ?C)  ?   Temp Source 07/16/21 1458 Oral  ?   SpO2 07/16/21 1458 97 %  ?   Weight --   ?   Height --   ?   Head Circumference --   ?   Peak Flow --   ?   Pain Score 07/16/21 1454 7  ?   Pain Loc --   ?   Pain Edu? --   ?   Excl. in GC? --   ? ?No data found. ? ?Updated Vital Signs ?BP (!) 122/96 (BP Location: Left Arm)   Pulse (!) 107   Temp (!) 97.4 ?F (36.3 ?C) (Oral)   Resp 20   SpO2 97%  ? ?Visual Acuity ?Right Eye Distance:   ?Left Eye Distance:   ?Bilateral Distance:   ? ?Right Eye Near:   ?Left Eye Near:    ?Bilateral Near:    ? ?Physical Exam ?Vitals and nursing note  reviewed.  ?Constitutional:   ?   Appearance: Normal appearance.  ?HENT:  ?   Head: Normocephalic.  ?Cardiovascular:  ?   Rate and Rhythm: Normal rate.  ?Pulmonary:  ?   Effort: Pulmonary effort is normal.  ?Musculoskeletal:     ?   General: Swelling and tenderness present.  ?   Comments: Swollen tender right 1st toe at Mtp joint.  From  nv and ns intact   ?Skin: ?   General: Skin is warm.  ?Neurological:  ?   General: No focal deficit present.  ?   Mental Status: He is alert.  ?Psychiatric:     ?   Mood and Affect: Mood normal.  ? ? ? ?UC Treatments / Results  ?Labs ?(all labs ordered are listed, but only abnormal results are displayed) ?Labs Reviewed - No data to display ? ?EKG ? ? ?Radiology ?No results found. ? ?Procedures ?Procedures (including critical care time) ? ?Medications Ordered in UC ?Medications - No data to display ? ?Initial Impression / Assessment and Plan / UC Course  ?I have reviewed the triage vital signs and the nursing notes. ? ?Pertinent labs & imaging results that were available during my care of the patient were reviewed by me and considered in my medical decision making (see chart for  details). ? ?  ? ?MDM:  Pt counseled on gout and diet. ?Final Clinical Impressions(s) / UC Diagnoses  ? ?Final diagnoses:  ?Acute gout of left foot, unspecified cause  ? ?Discharge Instructions   ?None ?  ? ?ED Prescriptions   ? ? Medication Sig Dispense Auth. Provider  ? indomethacin (INDOCIN) 50 MG capsule Take 1 capsule (50 mg total) by mouth 3 (three) times daily with meals. 30 capsule Elson Areas, [place]  ? ?  ? ?PDMP not reviewed this encounter. ?An After Visit Summary was printed and given to the patient. ? ?  ?Elson Areas, [place] ?07/16/21 1541 ? ?

## 2021-07-16 NOTE — ED Triage Notes (Signed)
Pt reports right foot pain since this morning and bilateral leg cramping. Pt states he walks about 19 miles a day with his job.  ?

## 2021-09-09 ENCOUNTER — Encounter (HOSPITAL_COMMUNITY): Payer: Self-pay

## 2021-09-09 ENCOUNTER — Ambulatory Visit (INDEPENDENT_AMBULATORY_CARE_PROVIDER_SITE_OTHER): Payer: Self-pay

## 2021-09-09 ENCOUNTER — Ambulatory Visit (HOSPITAL_COMMUNITY)
Admission: EM | Admit: 2021-09-09 | Discharge: 2021-09-09 | Disposition: A | Discharge status: Home / Self Care | Payer: Self-pay | Attending: Internal Medicine | Admitting: Internal Medicine

## 2021-09-09 DIAGNOSIS — R059 Cough, unspecified: Secondary | ICD-10-CM

## 2021-09-09 DIAGNOSIS — R053 Chronic cough: Secondary | ICD-10-CM | POA: Insufficient documentation

## 2021-09-09 DIAGNOSIS — F17200 Nicotine dependence, unspecified, uncomplicated: Secondary | ICD-10-CM | POA: Insufficient documentation

## 2021-09-09 LAB — CBC
HCT: 44.2 % (ref 39.0–52.0)
Hemoglobin: 14.1 g/dL (ref 13.0–17.0)
MCH: 31.3 pg (ref 26.0–34.0)
MCHC: 31.9 g/dL (ref 30.0–36.0)
MCV: 98 fL (ref 80.0–100.0)
Platelets: 257 10*3/uL (ref 150–400)
RBC: 4.51 MIL/uL (ref 4.22–5.81)
RDW: 12.7 % (ref 11.5–15.5)
WBC: 4 10*3/uL (ref 4.0–10.5)
nRBC: 0 % (ref 0.0–0.2)

## 2021-09-09 LAB — COMPREHENSIVE METABOLIC PANEL
ALT: 64 U/L — ABNORMAL HIGH (ref 0–44)
AST: 59 U/L — ABNORMAL HIGH (ref 15–41)
Albumin: 4.3 g/dL (ref 3.5–5.0)
Alkaline Phosphatase: 44 U/L (ref 38–126)
Anion gap: 8 (ref 5–15)
BUN: 5 mg/dL — ABNORMAL LOW (ref 6–20)
CO2: 26 mmol/L (ref 22–32)
Calcium: 9 mg/dL (ref 8.9–10.3)
Chloride: 106 mmol/L (ref 98–111)
Creatinine, Ser: 0.77 mg/dL (ref 0.61–1.24)
GFR, Estimated: >60 mL/min (ref >60)
Glucose, Bld: 100 mg/dL — ABNORMAL HIGH (ref 70–99)
Potassium: 3.9 mmol/L (ref 3.5–5.1)
Sodium: 140 mmol/L (ref 135–145)
Total Bilirubin: 0.3 mg/dL (ref 0.3–1.2)
Total Protein: 8.3 g/dL — ABNORMAL HIGH (ref 6.5–8.1)

## 2021-09-09 MED ORDER — GUAIFENESIN ER 600 MG PO TB12: 600.0000 mg | ORAL_TABLET | Freq: Two times a day (BID) | ORAL | 0 refills | Status: AC

## 2021-09-09 MED ORDER — ALBUTEROL SULFATE HFA 108 (90 BASE) MCG/ACT IN AERS: 1.0000 | INHALATION_SPRAY | Freq: Four times a day (QID) | RESPIRATORY_TRACT | 0 refills | Status: DC | PRN

## 2021-09-09 MED ORDER — BENZONATATE 100 MG PO CAPS: 100.0000 mg | ORAL_CAPSULE | Freq: Three times a day (TID) | ORAL | 0 refills | Status: DC | PRN

## 2021-09-09 NOTE — ED Triage Notes (Signed)
Pt presents with non productive and congestion X 2 months that is causing chest & back discomfort.

## 2021-09-09 NOTE — Discharge Instructions (Addendum)
Please use your inhalers as prescribed Take the other medications as prescribed Chest Xray is negative for pneumonia Quitting smoking will help with cough Return to urgent care if symptoms worsen We will call you if labs are abnormal.

## 2021-09-10 NOTE — ED Provider Notes (Signed)
[facility]    CSN: [id number] Arrival date & time: 09/09/21  0846      History   Chief Complaint Chief Complaint  Patient presents with   URI    HPI Kent Graham is a 41 y.o. male with a history of about 15-pack-year history of smoking comes to urgent care with a 88-month history of cough and chest congestion.  Patient's symptoms started a couple of months ago and has been persistent.  No sputum production.  Patient denies any chest tightness.  He has occasional wheezing and also has some back discomfort and chest discomfort associated with coughing.  No fever or chills.  No coughing up blood.  No weight loss or night sweats.  Patient continues to smoke.Marland Kitchen   HPI  Past Medical History:  Diagnosis Date   Scoliosis     Patient Active Problem List   Diagnosis Date Noted   Tobacco use 11/26/2020   Muscle strain 11/26/2020    History reviewed. No pertinent surgical history.     Home Medications    Prior to Admission medications   Medication Sig Start Date End Date Taking? Authorizing Provider  albuterol (VENTOLIN HFA) 108 (90 Base) MCG/ACT inhaler Inhale 1-2 puffs into the lungs every 6 (six) hours as needed for wheezing or shortness of breath. 09/09/21  Yes Houa Nie, Britta Mccreedy, MD  benzonatate (TESSALON) 100 MG capsule Take 1 capsule (100 mg total) by mouth 3 (three) times daily as needed for cough. 09/09/21  Yes Shantay Sonn, Britta Mccreedy, MD  guaiFENesin (MUCINEX) 600 MG 12 hr tablet Take 1 tablet (600 mg total) by mouth 2 (two) times daily for 10 days. 09/09/21 09/19/21 Yes Shandreka Dante, Britta Mccreedy, MD  cyclobenzaprine (FLEXERIL) 5 MG tablet TAKE 1 TABLET BY MOUTH 2 TIMES DAILY AS NEEDED FOR MUSCLE SPASMS. 11/26/20   Earl Lagos, MD  diclofenac (VOLTAREN) 75 MG EC tablet Take 1 tablet (75 mg total) by mouth 2 (two) times daily. 11/12/20   Mardella Layman, MD  HYDROcodone-acetaminophen (NORCO/VICODIN) 5-325 MG tablet Take 1 tablet by mouth every 6 (six) hours as needed for moderate  pain or severe pain. 04/24/21   Mardella Layman, MD  indomethacin (INDOCIN) 50 MG capsule Take 1 capsule (50 mg total) by mouth 3 (three) times daily with meals. 07/16/21   Elson Areas, PA-C    Family History Family History  Problem Relation Age of Onset   Healthy Father     Social History Social History   Tobacco Use   Smoking status: Every Day    Packs/day: 0.50    Types: Cigarettes   Smokeless tobacco: Never  Vaping Use   Vaping Use: Never used  Substance Use Topics   Alcohol use: Yes   Drug use: Not Currently    Types: Marijuana     Allergies   Patient has no known allergies.   Review of Systems Review of Systems As per HPI.  Physical Exam Triage Vital Signs ED Triage Vitals  Enc Vitals Group     BP 09/09/21 0941 133/73     Pulse Rate 09/09/21 0941 (!) 101     Resp 09/09/21 0941 17     Temp 09/09/21 0941 98.1 F (36.7 C)     Temp Source 09/09/21 0941 Oral     SpO2 09/09/21 0941 93 %     Weight --      Height --      Head Circumference --      Peak Flow --  Pain Score 09/09/21 0949 4     Pain Loc --      Pain Edu? --      Excl. in GC? --    No data found.  Updated Vital Signs BP 133/73 (BP Location: Right Arm)   Pulse (!) 101   Temp 98.1 F (36.7 C) (Oral)   Resp 17   SpO2 93%   Visual Acuity Right Eye Distance:   Left Eye Distance:   Bilateral Distance:    Right Eye Near:   Left Eye Near:    Bilateral Near:     Physical Exam Vitals and nursing note reviewed.  Constitutional:      General: He is not in acute distress.    Appearance: He is not ill-appearing.  HENT:     Right Ear: Tympanic membrane normal.     Left Ear: Tympanic membrane normal.     Mouth/Throat:     Mouth: Mucous membranes are moist.     Pharynx: No posterior oropharyngeal erythema.  Cardiovascular:     Rate and Rhythm: Normal rate and regular rhythm.     Pulses: Normal pulses.     Heart sounds: Normal heart sounds.  Pulmonary:     Effort: Pulmonary effort  is normal.     Breath sounds: Normal breath sounds. No wheezing, rhonchi or rales.  Abdominal:     General: Bowel sounds are normal.     Palpations: Abdomen is soft.  Musculoskeletal:        General: Normal range of motion.  Neurological:     Mental Status: He is alert.     UC Treatments / Results  Labs (all labs ordered are listed, but only abnormal results are displayed) Labs Reviewed  COMPREHENSIVE METABOLIC PANEL - Abnormal; Notable for the following components:      Result Value   Glucose, Bld 100 (*)    BUN 5 (*)    Total Protein 8.3 (*)    AST 59 (*)    ALT 64 (*)    All other components within normal limits  CBC    EKG   Radiology DG Chest 2 View  Result Date: 09/09/2021 CLINICAL DATA:  Productive cough. EXAM: CHEST - 2 VIEW COMPARISON:  None Available. FINDINGS: The heart size and mediastinal contours are within normal limits. Both lungs are clear. The visualized skeletal structures are unremarkable. IMPRESSION: No active cardiopulmonary disease. Electronically Signed   By: Larose Hires D.O.   On: 09/09/2021 10:35    Procedures Procedures (including critical care time)  Medications Ordered in UC Medications - No data to display  Initial Impression / Assessment and Plan / UC Course  I have reviewed the triage vital signs and the nursing notes.  Pertinent labs & imaging results that were available during my care of the patient were reviewed by me and considered in my medical decision making (see chart for details).     1.  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 Patient is in the contemplative state of smoke cessation Time spent on smoke cessation is less than 10 minutes. Tessalon Perles as needed for cough Albuterol inhaler as needed for shortness of breath/wheezing Mucinex twice daily Return to urgent care if symptoms worsen BMP and CBC We will call you with  recommendations if labs are abnormal.  Final Clinical Impressions(s) / UC Diagnoses   Final diagnoses:  Chronic cough  Tobacco use disorder  Discharge Instructions      Please use your inhalers as prescribed Take the other medications as prescribed Chest Xray is negative for pneumonia Quitting smoking will help with cough Return to urgent care if symptoms worsen We will call you if labs are abnormal.   ED Prescriptions     Medication Sig Dispense Auth. Provider   benzonatate (TESSALON) 100 MG capsule Take 1 capsule (100 mg total) by mouth 3 (three) times daily as needed for cough. 30 capsule Matson Welch, Britta MccreedyPhilip O, MD   albuterol (VENTOLIN HFA) 108 (90 Base) MCG/ACT inhaler Inhale 1-2 puffs into the lungs every 6 (six) hours as needed for wheezing or shortness of breath. 18 g Merrilee JanskyLamptey, Bailey Faiella O, MD   guaiFENesin (MUCINEX) 600 MG 12 hr tablet Take 1 tablet (600 mg total) by mouth 2 (two) times daily for 10 days. 20 tablet Elisheva Fallas, Britta MccreedyPhilip O, MD      PDMP not reviewed this encounter.   Merrilee JanskyLamptey, Sakura Denis O, MD 09/10/21 1755

## 2022-04-21 ENCOUNTER — Ambulatory Visit
Admission: EM | Admit: 2022-04-21 | Discharge: 2022-04-21 | Disposition: A | Discharge status: Home / Self Care | Payer: Self-pay | Attending: Physician Assistant | Admitting: Physician Assistant

## 2022-04-21 ENCOUNTER — Other Ambulatory Visit: Payer: Self-pay

## 2022-04-21 DIAGNOSIS — J069 Acute upper respiratory infection, unspecified: Secondary | ICD-10-CM | POA: Insufficient documentation

## 2022-04-21 DIAGNOSIS — R6889 Other general symptoms and signs: Secondary | ICD-10-CM | POA: Insufficient documentation

## 2022-04-21 DIAGNOSIS — R0602 Shortness of breath: Secondary | ICD-10-CM

## 2022-04-21 DIAGNOSIS — Z1152 Encounter for screening for COVID-19: Secondary | ICD-10-CM | POA: Insufficient documentation

## 2022-04-21 MED ORDER — OSELTAMIVIR PHOSPHATE 75 MG PO CAPS: 75.0000 mg | ORAL_CAPSULE | Freq: Two times a day (BID) | ORAL | 0 refills | Status: DC

## 2022-04-21 MED ORDER — ALBUTEROL SULFATE HFA 108 (90 BASE) MCG/ACT IN AERS: 1.0000 | INHALATION_SPRAY | Freq: Four times a day (QID) | RESPIRATORY_TRACT | 0 refills | Status: DC | PRN

## 2022-04-21 NOTE — ED Triage Notes (Signed)
Pt presents to uc with co of sob, and fatigue. Pt reports symptoms started this morning. No otc medications.

## 2022-04-21 NOTE — ED Provider Notes (Addendum)
[facility]    CSN: [id number] Arrival date & time: 04/21/22  1000      History   Chief Complaint Chief Complaint  Patient presents with   Fatigue   Shortness of Breath    HPI Kent Graham is a 42 y.o. male.   Patient here today for evaluation of shortness of breath and fatigue.  He reports symptoms started this morning.  He has also had some cough and congestion.  He does report some body aches.  He is unsure of fever.  He reports typically he does not get sick.  He has taken over-the-counter pain reliever without resolution. He requests albuterol inhaler if possible to help with shortness of breath.   The history is provided by the patient.  Shortness of Breath Associated symptoms: cough   Associated symptoms: no fever and no vomiting     Past Medical History:  Diagnosis Date   Scoliosis     Patient Active Problem List   Diagnosis Date Noted   Tobacco use 11/26/2020   Muscle strain 11/26/2020    No past surgical history on file.     Home Medications    Prior to Admission medications   Medication Sig Start Date End Date Taking? Authorizing Provider  albuterol (VENTOLIN HFA) 108 (90 Base) MCG/ACT inhaler Inhale 1-2 puffs into the lungs every 6 (six) hours as needed for wheezing or shortness of breath. 04/21/22  Yes Francene Finders, PA-C  oseltamivir (TAMIFLU) 75 MG capsule Take 1 capsule (75 mg total) by mouth every 12 (twelve) hours. 04/21/22  Yes Francene Finders, PA-C  benzonatate (TESSALON) 100 MG capsule Take 1 capsule (100 mg total) by mouth 3 (three) times daily as needed for cough. Patient not taking: Reported on 04/21/2022 09/09/21   Chase Picket, MD  cyclobenzaprine (FLEXERIL) 5 MG tablet TAKE 1 TABLET BY MOUTH 2 TIMES DAILY AS NEEDED FOR MUSCLE SPASMS. Patient not taking: Reported on 04/21/2022 11/26/20   Aldine Contes, MD  diclofenac (VOLTAREN) 75 MG EC tablet Take 1 tablet (75 mg total) by mouth 2 (two) times daily. Patient not  taking: Reported on 04/21/2022 11/12/20   Vanessa Kick, MD  HYDROcodone-acetaminophen (NORCO/VICODIN) 5-325 MG tablet Take 1 tablet by mouth every 6 (six) hours as needed for moderate pain or severe pain. Patient not taking: Reported on 04/21/2022 04/24/21   Vanessa Kick, MD  indomethacin (INDOCIN) 50 MG capsule Take 1 capsule (50 mg total) by mouth 3 (three) times daily with meals. Patient not taking: Reported on 04/21/2022 07/16/21   Fransico Meadow, PA-C    Family History Family History  Problem Relation Age of Onset   Healthy Father     Social History Social History   Tobacco Use   Smoking status: Some Days    Packs/day: 0.50    Types: Cigarettes   Smokeless tobacco: Never  Vaping Use   Vaping Use: Every day  Substance Use Topics   Alcohol use: Yes   Drug use: Not Currently    Types: Marijuana     Allergies   Patient has no known allergies.   Review of Systems Review of Systems  Constitutional:  Positive for fatigue. Negative for chills and fever.  HENT:  Positive for congestion and rhinorrhea.   Eyes:  Negative for discharge and redness.  Respiratory:  Positive for cough and shortness of breath.   Gastrointestinal:  Negative for diarrhea and vomiting.     Physical Exam Triage Vital Signs ED Triage Vitals  Enc Vitals Group     BP 04/21/22 1006 128/82     Pulse Rate 04/21/22 1006 90     Resp 04/21/22 1006 19     Temp 04/21/22 1006 98 F (36.7 C)     Temp src --      SpO2 04/21/22 1006 94 %     Weight --      Height --      Head Circumference --      Peak Flow --      Pain Score 04/21/22 1005 7     Pain Loc --      Pain Edu? --      Excl. in GC? --    No data found.  Updated Vital Signs BP 128/82   Pulse 90   Temp 98 F (36.7 C)   Resp 19   SpO2 94%     Physical Exam Vitals and nursing note reviewed.  Constitutional:      General: He is not in acute distress.    Appearance: Normal appearance. He is not ill-appearing.  HENT:     Head:  Normocephalic and atraumatic.     Nose: Congestion present.     Mouth/Throat:     Mouth: Mucous membranes are moist.     Pharynx: Oropharynx is clear. No oropharyngeal exudate or posterior oropharyngeal erythema.  Eyes:     Conjunctiva/sclera: Conjunctivae normal.  Cardiovascular:     Rate and Rhythm: Normal rate and regular rhythm.     Heart sounds: Normal heart sounds. No murmur heard. Pulmonary:     Effort: Pulmonary effort is normal. No respiratory distress.     Breath sounds: Normal breath sounds. No wheezing, rhonchi or rales.  Skin:    General: Skin is warm and dry.  Neurological:     Mental Status: He is alert.  Psychiatric:        Mood and Affect: Mood normal.        Thought Content: Thought content normal.      UC Treatments / Results  Labs (all labs ordered are listed, but only abnormal results are displayed) Labs Reviewed  SARS CORONAVIRUS 2 (TAT 6-24 HRS)    EKG   Radiology No results found.  Procedures Procedures (including critical care time)  Medications Ordered in UC Medications - No data to display  Initial Impression / Assessment and Plan / UC Course  I have reviewed the triage vital signs and the nursing notes.  Pertinent labs & imaging results that were available during my care of the patient were reviewed by me and considered in my medical decision making (see chart for details).    EKG ordered given shortness of breath and fatigue- no concerning findings, patient denies any chest pain. Suspect viral etiology of symptoms- most consistent with influenza. Offered tamiflu which patient would like to proceed with. Will also screen for covid. Recommend symptomatic treatment, increased fluids and rest otherwise. Encouraged follow up if no gradual improvement or with any further concerns.   Final Clinical Impressions(s) / UC Diagnoses   Final diagnoses:  Acute upper respiratory infection  Encounter for screening for COVID-19  Flu-like symptoms    Discharge Instructions   None    ED Prescriptions     Medication Sig Dispense Auth. Provider   oseltamivir (TAMIFLU) 75 MG capsule Take 1 capsule (75 mg total) by mouth every 12 (twelve) hours. 10 capsule Tomi Bamberger, PA-C   albuterol (VENTOLIN HFA) 108 (90 Base) MCG/ACT inhaler Inhale 1-2  puffs into the lungs every 6 (six) hours as needed for wheezing or shortness of breath. 8 g Francene Finders, PA-C      PDMP not reviewed this encounter.   Francene Finders, PA-C 04/21/22 1311    Francene Finders, PA-C 04/21/22 1313

## 2022-04-22 LAB — SARS CORONAVIRUS 2 (TAT 6-24 HRS): SARS Coronavirus 2: NEGATIVE

## 2022-09-20 ENCOUNTER — Emergency Department (HOSPITAL_COMMUNITY)
Admission: EM | Admit: 2022-09-20 | Discharge: 2022-09-20 | Disposition: A | Discharge status: Home / Self Care | Payer: 59 | Attending: Emergency Medicine | Admitting: Emergency Medicine

## 2022-09-20 ENCOUNTER — Emergency Department (HOSPITAL_COMMUNITY): Payer: 59

## 2022-09-20 ENCOUNTER — Encounter (HOSPITAL_COMMUNITY): Payer: Self-pay

## 2022-09-20 ENCOUNTER — Other Ambulatory Visit: Payer: Self-pay

## 2022-09-20 DIAGNOSIS — S51812A Laceration without foreign body of left forearm, initial encounter: Secondary | ICD-10-CM | POA: Insufficient documentation

## 2022-09-20 DIAGNOSIS — K047 Periapical abscess without sinus: Secondary | ICD-10-CM | POA: Diagnosis not present

## 2022-09-20 DIAGNOSIS — Z23 Encounter for immunization: Secondary | ICD-10-CM | POA: Diagnosis not present

## 2022-09-20 MED ORDER — AMOXICILLIN-POT CLAVULANATE 875-125 MG PO TABS: 1.0000 | ORAL_TABLET | Freq: Two times a day (BID) | ORAL | 0 refills | Status: DC

## 2022-09-20 MED ORDER — BACITRACIN ZINC 500 UNIT/GM EX OINT
TOPICAL_OINTMENT | Freq: Two times a day (BID) | CUTANEOUS | Status: DC
  Administered 2022-09-20: 1 via TOPICAL
  Filled 2022-09-20: qty 0.9

## 2022-09-20 MED ORDER — LIDOCAINE-EPINEPHRINE (PF) 2 %-1:200000 IJ SOLN
10.0000 mL | Freq: Once | INTRAMUSCULAR | Status: AC
  Administered 2022-09-20: 10 mL
  Filled 2022-09-20: qty 20

## 2022-09-20 MED ORDER — TETANUS-DIPHTH-ACELL PERTUSSIS 5-2.5-18.5 LF-MCG/0.5 IM SUSY
0.5000 mL | PREFILLED_SYRINGE | Freq: Once | INTRAMUSCULAR | Status: AC
  Administered 2022-09-20: 0.5 mL via INTRAMUSCULAR
  Filled 2022-09-20: qty 0.5
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
## 2022-09-20 NOTE — ED Notes (Signed)
Put Bacitracin on left forearm and covered with Mepilex. Gave pt a couple of extra Mepilex.
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
## 2022-09-20 NOTE — Discharge Instructions (Addendum)
[facility]  [facility]  OPENS AT 8:00 AM-3:00 PM REOPENS BETWEEN 6:00-8:00 PM  [address]  [place], [place] [address] [phone]   Please follow-up with the dentist I have attached your for you regarding your periapical abscesses.  Please take the Augmentin I prescribed for you as well.  Please have your sutures removed in 7 days.  You have 5 sutures placed so please make sure they removed 5 sutures.  If you begin to experience change or worsening of symptoms please return to ER.

## 2022-09-20 NOTE — ED Notes (Signed)
Per PA Schuman, ok to give the pt something to eat. Gave the pt a [place] sandwich, apple sauce, and graham crackers. Pt stating he hasn't eaten in 5 days and is feeling weak. Providers aware.

## 2022-10-25 ENCOUNTER — Other Ambulatory Visit: Payer: Self-pay

## 2022-10-25 ENCOUNTER — Emergency Department (HOSPITAL_COMMUNITY)
Admission: EM | Admit: 2022-10-25 | Discharge: 2022-10-26 | Disposition: A | Discharge status: Home / Self Care | Payer: 59 | Attending: Emergency Medicine | Admitting: Emergency Medicine

## 2022-10-25 ENCOUNTER — Encounter (HOSPITAL_COMMUNITY): Payer: Self-pay

## 2022-10-25 DIAGNOSIS — W260XXA Contact with knife, initial encounter: Secondary | ICD-10-CM | POA: Insufficient documentation

## 2022-10-25 DIAGNOSIS — Z23 Encounter for immunization: Secondary | ICD-10-CM | POA: Insufficient documentation

## 2022-10-25 DIAGNOSIS — S61412A Laceration without foreign body of left hand, initial encounter: Secondary | ICD-10-CM

## 2022-10-25 MED ORDER — LIDOCAINE-EPINEPHRINE (PF) 2 %-1:200000 IJ SOLN
INTRAMUSCULAR | Status: AC
  Filled 2022-10-25: qty 20

## 2022-10-25 MED ORDER — LIDOCAINE HCL 2 % IJ SOLN
INTRAMUSCULAR | Status: AC
  Filled 2022-10-25: qty 20

## 2022-10-25 MED ORDER — TETANUS-DIPHTH-ACELL PERTUSSIS 5-2.5-18.5 LF-MCG/0.5 IM SUSY
0.5000 mL | PREFILLED_SYRINGE | Freq: Once | INTRAMUSCULAR | Status: AC
  Administered 2022-10-26: 0.5 mL via INTRAMUSCULAR
  Filled 2022-10-25: qty 0.5

## 2022-10-25 MED ORDER — LIDOCAINE-EPINEPHRINE (PF) 2 %-1:200000 IJ SOLN
20.0000 mL | Freq: Once | INTRAMUSCULAR | Status: AC
  Administered 2022-10-25: 20 mL

## 2022-10-25 NOTE — ED Notes (Addendum)
This RN notified pt that provider would be by to see him shortly.  Pt a little calmer following update.

## 2022-10-25 NOTE — Discharge Instructions (Addendum)
You need to change the bandage on Thursday and then daily for the next week.  You can either use the dressings that you were given or put some Vaseline on the area and wrap it up.  The stitches you have are dissolvable so they do not need to be removed.  If you start having redness, drainage or other concerns return to the emergency room.
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
## 2022-10-26 DIAGNOSIS — S61412A Laceration without foreign body of left hand, initial encounter: Secondary | ICD-10-CM | POA: Diagnosis not present
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
## 2022-10-26 NOTE — ED Notes (Signed)
Pt to be discharged following wound dressing.  Per provider, pt will need Xeroform and Coband dressing over wound.  Delay in performing dressing and discharge due to this RN being with another critical pt in assignment.

## 2022-10-26 NOTE — ED Notes (Signed)
 Provider at bedside at this time

## 2022-10-31 ENCOUNTER — Observation Stay (HOSPITAL_COMMUNITY): Payer: 59

## 2022-10-31 ENCOUNTER — Other Ambulatory Visit: Payer: Self-pay

## 2022-10-31 ENCOUNTER — Emergency Department (HOSPITAL_COMMUNITY): Payer: 59

## 2022-10-31 ENCOUNTER — Observation Stay (HOSPITAL_COMMUNITY)
Admission: EM | Admit: 2022-10-31 | Discharge: 2022-11-02 | Disposition: A | Discharge status: Home / Self Care | Payer: 59 | Attending: Internal Medicine | Admitting: Internal Medicine

## 2022-10-31 ENCOUNTER — Encounter (HOSPITAL_COMMUNITY): Payer: Self-pay | Admitting: *Deleted

## 2022-10-31 DIAGNOSIS — S6982XD Other specified injuries of left wrist, hand and finger(s), subsequent encounter: Secondary | ICD-10-CM | POA: Insufficient documentation

## 2022-10-31 DIAGNOSIS — Z4802 Encounter for removal of sutures: Secondary | ICD-10-CM | POA: Diagnosis not present

## 2022-10-31 DIAGNOSIS — S61412A Laceration without foreign body of left hand, initial encounter: Secondary | ICD-10-CM

## 2022-10-31 DIAGNOSIS — W260XXD Contact with knife, subsequent encounter: Secondary | ICD-10-CM | POA: Diagnosis not present

## 2022-10-31 DIAGNOSIS — Z789 Other specified health status: Secondary | ICD-10-CM

## 2022-10-31 DIAGNOSIS — L089 Local infection of the skin and subcutaneous tissue, unspecified: Principal | ICD-10-CM

## 2022-10-31 DIAGNOSIS — F17210 Nicotine dependence, cigarettes, uncomplicated: Secondary | ICD-10-CM | POA: Diagnosis not present

## 2022-10-31 DIAGNOSIS — F1090 Alcohol use, unspecified, uncomplicated: Secondary | ICD-10-CM

## 2022-10-31 DIAGNOSIS — Z720 Tobacco use: Secondary | ICD-10-CM | POA: Diagnosis present

## 2022-10-31 DIAGNOSIS — M79642 Pain in left hand: Secondary | ICD-10-CM | POA: Diagnosis present

## 2022-10-31 LAB — CBC WITH DIFFERENTIAL/PLATELET
Abs Immature Granulocytes: 0.04 10*3/uL (ref 0.00–0.07)
Basophils Absolute: 0 10*3/uL (ref 0.0–0.1)
Basophils Relative: 1 %
Eosinophils Absolute: 0.1 10*3/uL (ref 0.0–0.5)
Eosinophils Relative: 1 %
HCT: 46.7 % (ref 39.0–52.0)
Hemoglobin: 15.3 g/dL (ref 13.0–17.0)
Immature Granulocytes: 1 %
Lymphocytes Relative: 26 %
Lymphs Abs: 1.4 10*3/uL (ref 0.7–4.0)
MCH: 33.1 pg (ref 26.0–34.0)
MCHC: 32.8 g/dL (ref 30.0–36.0)
MCV: 101.1 fL — ABNORMAL HIGH (ref 80.0–100.0)
Monocytes Absolute: 0.6 10*3/uL (ref 0.1–1.0)
Monocytes Relative: 11 %
Neutro Abs: 3.2 10*3/uL (ref 1.7–7.7)
Neutrophils Relative %: 60 %
Platelets: 299 10*3/uL (ref 150–400)
RBC: 4.62 MIL/uL (ref 4.22–5.81)
RDW: 12.6 % (ref 11.5–15.5)
WBC: 5.3 10*3/uL (ref 4.0–10.5)
nRBC: 0 % (ref 0.0–0.2)

## 2022-10-31 LAB — COMPREHENSIVE METABOLIC PANEL
ALT: 54 U/L — ABNORMAL HIGH (ref 0–44)
AST: 51 U/L — ABNORMAL HIGH (ref 15–41)
Albumin: 4.2 g/dL (ref 3.5–5.0)
Alkaline Phosphatase: 33 U/L — ABNORMAL LOW (ref 38–126)
Anion gap: 11 (ref 5–15)
BUN: 8 mg/dL (ref 6–20)
CO2: 22 mmol/L (ref 22–32)
Calcium: 9 mg/dL (ref 8.9–10.3)
Chloride: 107 mmol/L (ref 98–111)
Creatinine, Ser: 0.75 mg/dL (ref 0.61–1.24)
GFR, Estimated: >60 mL/min (ref >60)
Glucose, Bld: 95 mg/dL (ref 70–99)
Potassium: 3.7 mmol/L (ref 3.5–5.1)
Sodium: 140 mmol/L (ref 135–145)
Total Bilirubin: 0.4 mg/dL (ref 0.3–1.2)
Total Protein: 7.6 g/dL (ref 6.5–8.1)

## 2022-10-31 LAB — LACTIC ACID, PLASMA
Lactic Acid, Venous: 1.1 mmol/L (ref 0.5–1.9)
Lactic Acid, Venous: 1.1 mmol/L (ref 0.5–1.9)

## 2022-10-31 MED ORDER — ONDANSETRON HCL 4 MG/2ML IJ SOLN: 4.0000 mg | Freq: Four times a day (QID) | INTRAMUSCULAR | Status: DC | PRN

## 2022-10-31 MED ORDER — GADOBUTROL 1 MMOL/ML IV SOLN
8.0000 mL | Freq: Once | INTRAVENOUS | Status: AC | PRN
  Administered 2022-10-31: 8 mL via INTRAVENOUS

## 2022-10-31 MED ORDER — SENNOSIDES-DOCUSATE SODIUM 8.6-50 MG PO TABS: 1.0000 | ORAL_TABLET | Freq: Every evening | ORAL | Status: DC | PRN

## 2022-10-31 MED ORDER — LORAZEPAM 0.5 MG PO TABS
0.5000 mg | ORAL_TABLET | Freq: Once | ORAL | Status: AC | PRN
  Administered 2022-10-31: 0.5 mg via ORAL
  Filled 2022-10-31: qty 1

## 2022-10-31 MED ORDER — NICOTINE 21 MG/24HR TD PT24
21.0000 mg | MEDICATED_PATCH | Freq: Every day | TRANSDERMAL | Status: DC
  Administered 2022-10-31 – 2022-11-02 (×3): 21 mg via TRANSDERMAL
  Filled 2022-10-31 (×3): qty 1

## 2022-10-31 MED ORDER — IBUPROFEN 400 MG PO TABS
400.0000 mg | ORAL_TABLET | Freq: Four times a day (QID) | ORAL | Status: DC | PRN
  Administered 2022-10-31 – 2022-11-02 (×3): 400 mg via ORAL
  Filled 2022-10-31 (×4): qty 1

## 2022-10-31 MED ORDER — VANCOMYCIN HCL 1250 MG/250ML IV SOLN
1250.0000 mg | Freq: Two times a day (BID) | INTRAVENOUS | Status: DC
  Administered 2022-11-01 – 2022-11-02 (×3): 1250 mg via INTRAVENOUS
  Filled 2022-10-31 (×4): qty 250

## 2022-10-31 MED ORDER — ONDANSETRON HCL 4 MG PO TABS: 4.0000 mg | ORAL_TABLET | Freq: Four times a day (QID) | ORAL | Status: DC | PRN

## 2022-10-31 MED ORDER — VANCOMYCIN HCL 1500 MG/300ML IV SOLN
1500.0000 mg | Freq: Once | INTRAVENOUS | Status: AC
  Administered 2022-10-31: 1500 mg via INTRAVENOUS
  Filled 2022-10-31: qty 300

## 2022-10-31 MED ORDER — BACITRACIN ZINC 500 UNIT/GM EX OINT
TOPICAL_OINTMENT | Freq: Two times a day (BID) | CUTANEOUS | Status: DC
  Filled 2022-10-31: qty 1.8

## 2022-10-31 NOTE — ED Provider Notes (Signed)
[place] EMERGENCY DEPARTMENT AT [facility] Provider Note   CSN: [id number] Arrival date & time: 10/31/22  1335     History {Add pertinent medical, surgical, social history, OB history to HPI:1} Chief Complaint  Patient presents with   Laceration    Kent Graham is a 42 y.o. male.   Laceration      Home Medications Prior to Admission medications   Medication Sig Start Date End Date Taking? Authorizing Provider  albuterol (VENTOLIN HFA) 108 (90 Base) MCG/ACT inhaler Inhale 1-2 puffs into the lungs every 6 (six) hours as needed for wheezing or shortness of breath. 04/21/22   Tomi Bamberger, PA-C  amoxicillin-clavulanate (AUGMENTIN) 875-125 MG tablet Take 1 tablet by mouth every 12 (twelve) hours. 09/20/22   Netta Corrigan, PA-C  benzonatate (TESSALON) 100 MG capsule Take 1 capsule (100 mg total) by mouth 3 (three) times daily as needed for cough. Patient not taking: Reported on 04/21/2022 09/09/21   Merrilee Jansky, MD  cyclobenzaprine (FLEXERIL) 5 MG tablet TAKE 1 TABLET BY MOUTH 2 TIMES DAILY AS NEEDED FOR MUSCLE SPASMS. Patient not taking: Reported on 04/21/2022 11/26/20   Earl Lagos, MD  diclofenac (VOLTAREN) 75 MG EC tablet Take 1 tablet (75 mg total) by mouth 2 (two) times daily. Patient not taking: Reported on 04/21/2022 11/12/20   Mardella Layman, MD  HYDROcodone-acetaminophen (NORCO/VICODIN) 5-325 MG tablet Take 1 tablet by mouth every 6 (six) hours as needed for moderate pain or severe pain. Patient not taking: Reported on 04/21/2022 04/24/21   Mardella Layman, MD  indomethacin (INDOCIN) 50 MG capsule Take 1 capsule (50 mg total) by mouth 3 (three) times daily with meals. Patient not taking: Reported on 04/21/2022 07/16/21   Elson Areas, PA-C  oseltamivir (TAMIFLU) 75 MG capsule Take 1 capsule (75 mg total) by mouth every 12 (twelve) hours. 04/21/22   Tomi Bamberger, PA-C      Allergies    Patient has no known allergies.    Review of Systems    Review of Systems  Physical Exam Updated Vital Signs BP 105/72 (BP Location: Right Arm)   Pulse 75   Temp 98.2 F (36.8 C) (Oral)   Resp 16   Wt 78 kg   SpO2 98%   BMI 28.62 kg/m  Physical Exam  ED Results / Procedures / Treatments   Labs (all labs ordered are listed, but only abnormal results are displayed) Labs Reviewed  CBC WITH DIFFERENTIAL/PLATELET - Abnormal; Notable for the following components:      Result Value   MCV 101.1 (*)    All other components within normal limits  COMPREHENSIVE METABOLIC PANEL - Abnormal; Notable for the following components:   AST 51 (*)    ALT 54 (*)    Alkaline Phosphatase 33 (*)    All other components within normal limits  CULTURE, BLOOD (ROUTINE X 2)  CULTURE, BLOOD (ROUTINE X 2)  LACTIC ACID, PLASMA  LACTIC ACID, PLASMA    EKG None  Radiology DG Hand Complete Left  Result Date: 10/31/2022 CLINICAL DATA:  Left hand swelling status post sutures. Question infection. Patient cut hand 10/25/2022. Handouts swollen. Question infected. Bleeding last 4 days. EXAM: LEFT HAND - COMPLETE 3+ VIEW COMPARISON:  Left wrist radiographs 09/20/2022 FINDINGS: On the comparison radiographs 09/20/2022, there were superficial lacerations of the soft tissues overlying the distal ulnar diaphysis. The lucent lacerations are no longer visualized, however there is now mild-to-moderate soft tissue swelling in this region, and  a 3 mm peripherally calcified and centrally lucent density within the region of soft tissue swelling. There is very mild possible periosteal reaction at the adjacent distal ulnar diaphysis. Joint spaces are preserved. IMPRESSION: In the region of the prior lacerations on the 09/20/2022 radiographs, there is now mild-to-moderate soft tissue swelling overlying the medial aspect of the distal ulnar diaphysis. Within the region of the soft tissue swelling there is a 3 mm calcific density and very mild possible periosteal reaction at the adjacent  distal ulnar diaphysis. This appearance may be secondary to calcifications from soft tissue healing. It is difficult to exclude very early periostitis as can be seen with cellulitis/adjacent early osteomyelitis. Electronically Signed   By: Neita Garnet M.D.   On: 10/31/2022 15:44    Procedures .Suture Removal  Date/Time: 10/31/2022 5:51 PM  Performed by: Achille Rich, PA-C Authorized by: Achille Rich, PA-C   Consent:    Consent obtained:  Verbal   Consent given by:  Patient   Risks, benefits, and alternatives were discussed: yes     Risks discussed:  Pain, wound separation and bleeding   Alternatives discussed:  No treatment Universal protocol:    Procedure explained and questions answered to patient or proxy's satisfaction: yes   Location:    Location:  Upper extremity   Upper extremity location:  Hand   Hand location:  L thumb Procedure details:    Wound appearance:  Warm, red, tender and indurated   Number of sutures removed:  11 Post-procedure details:    Post-removal:  Antibiotic ointment applied and dressing applied   Procedure completion:  Tolerated well, no immediate complications   {Document cardiac monitor, telemetry assessment procedure when appropriate:1}  Medications Ordered in ED Medications  bacitracin ointment (has no administration in time range)  LORazepam (ATIVAN) tablet 0.5 mg (has no administration in time range)    ED Course/ Medical Decision Making/ A&P   {   Click here for ABCD2, HEART and other calculatorsREFRESH Note before signing :1}                          Medical Decision Making Amount and/or Complexity of Data Reviewed Labs: ordered. Radiology: ordered.  Risk OTC drugs. Prescription drug management.   42 y.o. male presents to the ER for evaluation of ***. Differential diagnosis includes but is not limited to ***. Vital signs ***. Physical exam as noted above.   I independently reviewed and interpreted the patient's labs.  ***.    ***Portions of this report may have been transcribed using voice recognition software. Every effort was made to ensure accuracy; however, inadvertent computerized transcription errors may be present.   Final Clinical Impression(s) / ED Diagnoses Final diagnoses:  None    Rx / DC Orders ED Discharge Orders     None

## 2022-10-31 NOTE — Progress Notes (Signed)
Pharmacy Antibiotic Note  Kent Graham is a 42 y.o. male admitted on 10/31/2022 with L hand  wound infection .  Pharmacy has been consulted for Vancomycin dosing.  Plan: Vancomycin 1250mg  IV q12h to target AUC 400-550.   Estimated AUC on this regimen=488. Monitor renal function and cx data   Weight: 78 kg (172 lb)  Temp (24hrs), Avg:98.6 F (37 C), Min:98.2 F (36.8 C), Max:99 F (37.2 C)  Recent Labs  Lab 10/31/22 1452 10/31/22 1535  WBC 5.3  --   CREATININE 0.75  --   LATICACIDVEN 1.1 1.1    Estimated Creatinine Clearance: 115.9 mL/min (by C-G formula based on SCr of 0.75 mg/dL).    No Known Allergies  Antimicrobials this admission: 7/22 Vancomycin >>   Dose adjustments this admission:  Microbiology results: 7/22 BCx:   Thank you for allowing pharmacy to be a part of this patient's care.  Junita Push PharmD 10/31/2022 8:00 PM

## 2022-10-31 NOTE — H&P (Signed)
History and Physical    Kent Graham QIH:[id number] DOB: 1980/11/20 DOA: 10/31/2022  PCP: Pcp, No  Patient coming from: Home  I have personally briefly reviewed patient's old medical records in [facility]  Chief Complaint: Left hand wound  HPI: Kent Graham is a 42 y.o. male with history of alcohol and tobacco use who presented to the ED for evaluation of worsening left hand wound.  Patient initially was seen in the ED 7/16 after suffering a laceration on the palm of his hand near the base of the thumb.  This was an accidental laceration while handling a pocket knife.  Patient was given Tdap and laceration was repaired with 12 sutures placed.  Patient states that since then he has had increased swelling and pain to his left hand.  He has seen some bloody and purulent discharge from his wound.  He says range of motion of his fingers have remained intact but movement does worsen his pain.  He does not take any medications regularly.  He reports drinking about two 40 oz beers daily.  Last drink was a couple hours prior to arrival.  Also reports smoking between 0.5-1 PPD.  States he has not used any other illicit drugs for at least 3 months.  ED Course  Labs/Imaging on admission: I have personally reviewed following labs and imaging studies.  Initial vitals showed BP 123/83, pulse 85, RR 14, temp 99.0 F, SpO2 91% on room air.  Labs show WBC 5.3, hemoglobin 15.3, platelets 299,000, sodium 140, potassium 3.7, bicarb 22, BUN 8, creatinine 0.75, serum glucose 95, AST 51, ALT 54, alk phos 33, total bilirubin 0.4, lactic acid 1.1.  Blood cultures ordered and pending.  Left hand x-ray shows mild to moderate soft tissue swelling overlying medial aspect of distal ulnar diaphysis.  A 3 mm calcific density and very mild possible periosteal reaction at the adjacent distal ulnar diaphysis noted.  EDP discussed with on-call hand surgery team who recommended admission for IV antibiotics and  obtaining MRI of the hand.  The hospitalist service was consulted to admit for further evaluation and management.  Review of Systems: All systems reviewed and are negative except as documented in history of present illness above.   Past Medical History:  Diagnosis Date   Scoliosis     History reviewed. No pertinent surgical history.  Social History:  reports that he has been smoking cigarettes. He has never used smokeless tobacco. He reports current alcohol use. He reports that he does not currently use drugs after having used the following drugs: Marijuana.  No Known Allergies  Family History  Problem Relation Age of Onset   Healthy Father      Prior to Admission medications   Medication Sig Start Date End Date Taking? Authorizing Provider  albuterol (VENTOLIN HFA) 108 (90 Base) MCG/ACT inhaler Inhale 1-2 puffs into the lungs every 6 (six) hours as needed for wheezing or shortness of breath. 04/21/22   Tomi Bamberger, PA-C  amoxicillin-clavulanate (AUGMENTIN) 875-125 MG tablet Take 1 tablet by mouth every 12 (twelve) hours. 09/20/22   Netta Corrigan, PA-C  benzonatate (TESSALON) 100 MG capsule Take 1 capsule (100 mg total) by mouth 3 (three) times daily as needed for cough. Patient not taking: Reported on 04/21/2022 09/09/21   Merrilee Jansky, MD  cyclobenzaprine (FLEXERIL) 5 MG tablet TAKE 1 TABLET BY MOUTH 2 TIMES DAILY AS NEEDED FOR MUSCLE SPASMS. Patient not taking: Reported on 04/21/2022 11/26/20   Earl Lagos,  MD  diclofenac (VOLTAREN) 75 MG EC tablet Take 1 tablet (75 mg total) by mouth 2 (two) times daily. Patient not taking: Reported on 04/21/2022 11/12/20   Mardella Layman, MD  HYDROcodone-acetaminophen (NORCO/VICODIN) 5-325 MG tablet Take 1 tablet by mouth every 6 (six) hours as needed for moderate pain or severe pain. Patient not taking: Reported on 04/21/2022 04/24/21   Mardella Layman, MD  indomethacin (INDOCIN) 50 MG capsule Take 1 capsule (50 mg total) by mouth 3  (three) times daily with meals. Patient not taking: Reported on 04/21/2022 07/16/21   Elson Areas, PA-C  oseltamivir (TAMIFLU) 75 MG capsule Take 1 capsule (75 mg total) by mouth every 12 (twelve) hours. 04/21/22   Tomi Bamberger, PA-C    Physical Exam: Vitals:   10/31/22 1344 10/31/22 1718  BP: 123/83 105/72  Pulse: 85 75  Resp: 14 16  Temp: 99 F (37.2 C) 98.2 F (36.8 C)  TempSrc: Oral Oral  SpO2: 91% 98%  Weight: 78 kg    Constitutional: Resting in bed, NAD, calm, comfortable Eyes: EOMI, lids and conjunctivae normal ENMT: Mucous membranes are moist. Posterior pharynx clear of any exudate or lesions.Normal dentition.  Neck: normal, supple, no masses. Respiratory: clear to auscultation bilaterally, no wheezing, no crackles. Normal respiratory effort. No accessory muscle use.  Cardiovascular: Regular rate and rhythm, no murmurs / rubs / gallops. No extremity edema. 2+ radial pulses. Abdomen: no tenderness, no masses palpated.  Musculoskeletal: S/p laceration left hand mostly palmar aspect near the base of the thumb.  Sutures were removed and wound dressing has been placed.  Patient able to move all fingers, open and close fist.   Skin: S/p laceration left hand mostly palmar aspect near the base of the thumb.  Sutures were removed and wound dressing has been placed. Neurologic: ensation intact. Strength 5/5 in all 4.  Psychiatric: Alert and oriented x 3. Normal mood.       EKG: Not performed.  Assessment/Plan Principal Problem:   Laceration of hand with infection, left, initial encounter Active Problems:   Tobacco use   Alcohol use   Kent Graham is a 42 y.o. male with history of alcohol and tobacco use who is admitted for management of left hand wound infection.  Assessment and Plan: Left hand wound infection: S/p laceration to the palm of the left hand near the base of the thumb as pictured above.  Underwent laceration repair in the ED last week but returns with  increased swelling and pain concerning for developing infection. -Continue IV vancomycin -EDP discussed with hand surgery team -MRI left hand recommended which is ordered and pending  Alcohol use: Reports drinking two 40 ounces daily.  Last drink same day of admission.  Patient denies any history of withdrawal, monitor.  Elevated AST/ALT: Chronic mild elevation in setting of alcohol use.  Tobacco use: Patient reports smoking 0.5-1 PPD.  Nicotine patch ordered.   DVT prophylaxis: SCDs Start: 10/31/22 1936 Code Status: Full code Family Communication: Discussed with patient, he has discussed with his significant other Disposition Plan: Pending clinical progress Consults called: Hand surgery Severity of Illness: The appropriate patient status for this patient is OBSERVATION. Observation status is judged to be reasonable and necessary in order to provide the required intensity of service to ensure the patient's safety. The patient's presenting symptoms, physical exam findings, and initial radiographic and laboratory data in the context of their medical condition is felt to place them at decreased risk for further clinical deterioration. Furthermore,  it is anticipated that the patient will be medically stable for discharge from the hospital within 2 midnights of admission.   Darreld Mclean MD [facility]  If 7PM-7AM, please contact night-coverage [email or web address]  10/31/2022, 7:49 PM

## 2022-10-31 NOTE — ED Notes (Signed)
ED TO INPATIENT HANDOFF REPORT  ED Nurse Name and Phone #: Huntley Dec [phone]  S Name/Age/Gender Kent Graham 42 y.o. male Room/Bed: WHALB/WHALB  Code Status   Code Status: Full Code  Home/SNF/Other Patient oriented to: self, place, time, and situation Is this baseline? Yes   Triage Complete: Triage complete  Chief Complaint Laceration of hand with infection, left, initial encounter [X52.841L, L08.9]  Triage Note Pt cut hand on 7/16 due to laceration, hand now swollen and ? Infected, bleeding last 4 days 112/84-90-96%-16 CBG 90   Allergies No Known Allergies  Level of Care/Admitting Diagnosis ED Disposition     ED Disposition  Admit   Condition  --   Comment  Hospital Area: [facility] [[id number]]  Level of Care: Med-Surg [16]  May place patient in [facility] at [facility] or [facility] if equivalent level of care is available:: No  Covid Evaluation: Asymptomatic - no recent exposure (last 10 days) testing not required  Diagnosis: Laceration of hand with infection, left, initial encounter [2440102]  Admitting Physician: Charlsie Quest [[id number]]  Attending Physician: Charlsie Quest [[id number]]          B Medical/Surgery History Past Medical History:  Diagnosis Date   Scoliosis    History reviewed. No pertinent surgical history.   A IV Location/Drains/Wounds Patient Lines/Drains/Airways Status     Active Line/Drains/Airways     Name Placement date Placement time Site Days   Peripheral IV 10/31/22 20 G 1" Anterior;Distal;Right;Upper Arm 10/31/22  1450  Arm  less than 1            Intake/Output Last 24 hours No intake or output data in the 24 hours ending 10/31/22 1936  Labs/Imaging Results for orders placed or performed during the hospital encounter of 10/31/22 (from the past 48 hour(s))  CBC with Differential     Status: Abnormal   Collection Time: 10/31/22  2:52 PM  Result Value Ref Range   WBC 5.3 4.0 - 10.5 K/uL   RBC  4.62 4.22 - 5.81 MIL/uL   Hemoglobin 15.3 13.0 - 17.0 g/dL   HCT 59.5 63.8 - 75.6 %   MCV 101.1 (H) 80.0 - 100.0 fL   MCH 33.1 26.0 - 34.0 pg   MCHC 32.8 30.0 - 36.0 g/dL   RDW 43.3 29.5 - 18.8 %   Platelets 299 150 - 400 K/uL   nRBC 0.0 0.0 - 0.2 %   Neutrophils Relative % 60 %   Neutro Abs 3.2 1.7 - 7.7 K/uL   Lymphocytes Relative 26 %   Lymphs Abs 1.4 0.7 - 4.0 K/uL   Monocytes Relative 11 %   Monocytes Absolute 0.6 0.1 - 1.0 K/uL   Eosinophils Relative 1 %   Eosinophils Absolute 0.1 0.0 - 0.5 K/uL   Basophils Relative 1 %   Basophils Absolute 0.0 0.0 - 0.1 K/uL   Immature Granulocytes 1 %   Abs Immature Granulocytes 0.04 0.00 - 0.07 K/uL    Comment: Performed at [facility], [address], [place], [place] [address]  Comprehensive metabolic panel     Status: Abnormal   Collection Time: 10/31/22  2:52 PM  Result Value Ref Range   Sodium 140 135 - 145 mmol/L   Potassium 3.7 3.5 - 5.1 mmol/L   Chloride 107 98 - 111 mmol/L   CO2 22 22 - 32 mmol/L   Glucose, Bld 95 70 - 99 mg/dL    Comment: Glucose reference range applies only  to samples taken after fasting for at least 8 hours.   BUN 8 6 - 20 mg/dL   Creatinine, Ser 5.36 0.61 - 1.24 mg/dL   Calcium 9.0 8.9 - 64.4 mg/dL   Total Protein 7.6 6.5 - 8.1 g/dL   Albumin 4.2 3.5 - 5.0 g/dL   AST 51 (H) 15 - 41 U/L   ALT 54 (H) 0 - 44 U/L   Alkaline Phosphatase 33 (L) 38 - 126 U/L   Total Bilirubin 0.4 0.3 - 1.2 mg/dL   GFR, Estimated >03 >47 mL/min    Comment: (NOTE) Calculated using the CKD-EPI Creatinine Equation (2021)    Anion gap 11 5 - 15    Comment: Performed at [facility], [address], [place], [place] [address]  Lactic acid, plasma     Status: None   Collection Time: 10/31/22  2:52 PM  Result Value Ref Range   Lactic Acid, Venous 1.1 0.5 - 1.9 mmol/L    Comment: Performed at [facility], [address] [place], [place] [address]  Lactic acid, plasma      Status: None   Collection Time: 10/31/22  3:35 PM  Result Value Ref Range   Lactic Acid, Venous 1.1 0.5 - 1.9 mmol/L    Comment: Performed at [facility], [address], [place], [place] [address]   DG Hand Complete Left  Result Date: 10/31/2022 CLINICAL DATA:  Left hand swelling status post sutures. Question infection. Patient cut hand 10/25/2022. Handouts swollen. Question infected. Bleeding last 4 days. EXAM: LEFT HAND - COMPLETE 3+ VIEW COMPARISON:  Left wrist radiographs 09/20/2022 FINDINGS: On the comparison radiographs 09/20/2022, there were superficial lacerations of the soft tissues overlying the distal ulnar diaphysis. The lucent lacerations are no longer visualized, however there is now mild-to-moderate soft tissue swelling in this region, and a 3 mm peripherally calcified and centrally lucent density within the region of soft tissue swelling. There is very mild possible periosteal reaction at the adjacent distal ulnar diaphysis. Joint spaces are preserved. IMPRESSION: In the region of the prior lacerations on the 09/20/2022 radiographs, there is now mild-to-moderate soft tissue swelling overlying the medial aspect of the distal ulnar diaphysis. Within the region of the soft tissue swelling there is a 3 mm calcific density and very mild possible periosteal reaction at the adjacent distal ulnar diaphysis. This appearance may be secondary to calcifications from soft tissue healing. It is difficult to exclude very early periostitis as can be seen with cellulitis/adjacent early osteomyelitis. Electronically Signed   By: Neita Garnet M.D.   On: 10/31/2022 15:44    Pending Labs Unresulted Labs (From admission, onward)     Start     Ordered   11/01/22 0500  HIV Antibody (routine testing w rflx)  (HIV Antibody (Routine testing w reflex) panel)  Tomorrow morning,   R        10/31/22 1931   11/01/22 0500  CBC  Tomorrow morning,   R        10/31/22 1931   11/01/22 0500   Basic metabolic panel  Tomorrow morning,   R        10/31/22 1931   10/31/22 1732  Blood culture (routine x 2)  BLOOD CULTURE X 2,   R      10/31/22 1731            Vitals/Pain Today's Vitals   10/31/22 1344 10/31/22 1346 10/31/22 1718  BP: 123/83  105/72  Pulse: 85  75  Resp: 14  16  Temp: 99 F (37.2 C)  98.2 F (36.8 C)  TempSrc: Oral  Oral  SpO2: 91%  98%  Weight: 78 kg    PainSc:  10-Worst pain ever     Isolation Precautions No active isolations  Medications Medications  bacitracin ointment (has no administration in time range)  vancomycin (VANCOREADY) IVPB 1500 mg/300 mL (1,500 mg Intravenous New Bag/Given 10/31/22 1924)  ibuprofen (ADVIL) tablet 400 mg (has no administration in time range)  ondansetron (ZOFRAN) tablet 4 mg (has no administration in time range)    Or  ondansetron (ZOFRAN) injection 4 mg (has no administration in time range)  senna-docusate (Senokot-S) tablet 1 tablet (has no administration in time range)  nicotine (NICODERM CQ - dosed in mg/24 hours) patch 21 mg (has no administration in time range)  LORazepam (ATIVAN) tablet 0.5 mg (0.5 mg Oral Given 10/31/22 1923)    Mobility walks     Focused Assessments  R Recommendations: See Admitting Provider Note  Report given to:   Additional Notes:

## 2022-10-31 NOTE — Progress Notes (Signed)
A consult was received from an ED physician for Vancomycin per pharmacy dosing.  The patient's profile has been reviewed for ht/wt/allergies/indication/available labs.    A one time order has been placed for: Vancomycin 1.5gm IV x 1  Further antibiotics/pharmacy consults should be ordered by admitting physician if indicated.                       Thank you, Josefa Half 10/31/2022  5:59 PM

## 2022-10-31 NOTE — ED Triage Notes (Signed)
Pt cut hand on 7/16 due to laceration, hand now swollen and ? Infected, bleeding last 4 days 112/84-90-96%-16 CBG 90

## 2022-11-01 ENCOUNTER — Encounter: Payer: Self-pay | Admitting: Family Medicine

## 2022-11-01 ENCOUNTER — Other Ambulatory Visit (HOSPITAL_COMMUNITY): Payer: Self-pay

## 2022-11-01 DIAGNOSIS — S61412A Laceration without foreign body of left hand, initial encounter: Secondary | ICD-10-CM | POA: Diagnosis not present

## 2022-11-01 DIAGNOSIS — L089 Local infection of the skin and subcutaneous tissue, unspecified: Secondary | ICD-10-CM | POA: Diagnosis not present

## 2022-11-01 LAB — BASIC METABOLIC PANEL
Anion gap: 6 (ref 5–15)
BUN: 10 mg/dL (ref 6–20)
CO2: 24 mmol/L (ref 22–32)
Calcium: 9 mg/dL (ref 8.9–10.3)
Chloride: 107 mmol/L (ref 98–111)
Creatinine, Ser: 0.75 mg/dL (ref 0.61–1.24)
GFR, Estimated: >60 mL/min (ref >60)
Glucose, Bld: 96 mg/dL (ref 70–99)
Potassium: 3.9 mmol/L (ref 3.5–5.1)
Sodium: 137 mmol/L (ref 135–145)

## 2022-11-01 LAB — CBC
HCT: 45.6 % (ref 39.0–52.0)
Hemoglobin: 14.7 g/dL (ref 13.0–17.0)
MCH: 32.7 pg (ref 26.0–34.0)
MCHC: 32.2 g/dL (ref 30.0–36.0)
MCV: 101.6 fL — ABNORMAL HIGH (ref 80.0–100.0)
Platelets: 218 10*3/uL (ref 150–400)
RBC: 4.49 MIL/uL (ref 4.22–5.81)
RDW: 12.7 % (ref 11.5–15.5)
WBC: 4 10*3/uL (ref 4.0–10.5)
nRBC: 0 % (ref 0.0–0.2)

## 2022-11-01 LAB — CULTURE, BLOOD (ROUTINE X 2): Culture: NO GROWTH

## 2022-11-01 LAB — HIV ANTIBODY (ROUTINE TESTING W REFLEX): HIV Screen 4th Generation wRfx: NONREACTIVE

## 2022-11-01 MED ORDER — BACITRACIN ZINC 500 UNIT/GM EX OINT
TOPICAL_OINTMENT | Freq: Two times a day (BID) | CUTANEOUS | 0 refills | Status: AC
  Filled 2022-11-01: qty 84, 30d supply, fill #0

## 2022-11-01 MED ORDER — DICLOFENAC SODIUM 75 MG PO TBEC
75.0000 mg | DELAYED_RELEASE_TABLET | Freq: Two times a day (BID) | ORAL | 0 refills | Status: DC
  Filled 2022-11-01: qty 14, 7d supply, fill #0

## 2022-11-01 MED ORDER — "NU GAUZE 4PLY 4""X4"" PADS"
1.0000 | MEDICATED_PAD | Freq: Every day | 3 refills | Status: AC
  Filled 2022-11-01: qty 30, fill #0

## 2022-11-01 MED ORDER — AMOXICILLIN-POT CLAVULANATE 875-125 MG PO TABS
1.0000 | ORAL_TABLET | Freq: Two times a day (BID) | ORAL | 0 refills | Status: DC
  Filled 2022-11-01: qty 14, 7d supply, fill #0

## 2022-11-01 MED ORDER — NICOTINE 21 MG/24HR TD PT24
21.0000 mg | MEDICATED_PATCH | Freq: Every day | TRANSDERMAL | 0 refills | Status: AC
  Filled 2022-11-01: qty 28, 28d supply, fill #0

## 2022-11-01 NOTE — TOC Transition Note (Signed)
Transition of Care [facility]) - CM/SW Discharge Note   Patient Details  Name: Kent Graham MRN: [id number] Date of Birth: August 08, 1980  Transition of Care [facility]) CM/SW Contact:  Amada Jupiter, LCSW Phone Number: 11/01/2022, 3:09 PM   Clinical Narrative:     Met with patient today to review TOC referral to assist with securing PCP and medication assistance.  SDOH needs also reviewed with pt (housing and food concerns).  Pt reports that he is currently staying in the [facility] and plans to return tomorrow.  Pt has spoken with shelter to secure bed placement.  He will need bus pass for transportation and passes provided to him today.  He is working with staff at the shelter to secure more permanent housing for the longer term.   Pt confirms he has both [employer] and Medicaid coverage and have explained that we cannot assist him with meds through the [facility] program if he has insurance.  DC meds are to be filled through [facility] and will see if there are other discount programs that may be accessed if needed.   Pt confirms he does want TOC assist with securing a PCP and agreeable to a [facility] clinic.  Have been able to secure new PCP at the [facility] with 1st appt on 11/11/22 @ 11:20am.  Info placed on AVS.   At this time, no further TOC needs noted.  Will sign off - please reorder if new needs arise.  Final next level of care: Homeless Shelter Barriers to Discharge: No Barriers Identified   Patient Goals and CMS Choice CMS Medicare.gov Compare Post Acute Care list provided to:: Patient    Discharge Placement                         Discharge Plan and Services Additional resources added to the After Visit Summary for                  DME Arranged: N/A DME Agency: NA                  Social Determinants of Health (SDOH) Interventions SDOH Screenings   Food Insecurity: Food Insecurity Present (10/31/2022)  Housing: Medium Risk (10/31/2022)   Transportation Needs: No Transportation Needs (10/31/2022)  Utilities: Not At Risk (10/31/2022)  Depression (PHQ2-9): Low Risk  (11/25/2020)  Tobacco Use: High Risk (10/31/2022)     Readmission Risk Interventions     No data to display

## 2022-11-01 NOTE — Consult Note (Signed)
Reason for Consult:Left hand infection Referring Physician: Pleas Koch Time called: [id number] Time at bedside: 1132   Kent Graham is an 42 y.o. male.  HPI: Moris cut his hand on his knife about a week ago. He had it sutured but a few days ago the pain and swelling began to get worse. He came to the ED yesterday for evaluation. The wound was opened and MRI showed an abscess and hand surgery was consulted the following day. He notes the hand is feeling better and is much softer than yesterday. The wound has been draining a fair amount of bloody material. He is LHD and works in Aeronautical engineer.  Past Medical History:  Diagnosis Date   Scoliosis     History reviewed. No pertinent surgical history.  Family History  Problem Relation Age of Onset   Healthy Father     Social History:  reports that he has been smoking cigarettes. He has never used smokeless tobacco. He reports current alcohol use. He reports that he does not currently use drugs after having used the following drugs: Marijuana.  Allergies: No Known Allergies  Medications: I have reviewed the patient's current medications.  Results for orders placed or performed during the hospital encounter of 10/31/22 (from the past 48 hour(s))  CBC with Differential     Status: Abnormal   Collection Time: 10/31/22  2:52 PM  Result Value Ref Range   WBC 5.3 4.0 - 10.5 K/uL   RBC 4.62 4.22 - 5.81 MIL/uL   Hemoglobin 15.3 13.0 - 17.0 g/dL   HCT 13.0 86.5 - 78.4 %   MCV 101.1 (H) 80.0 - 100.0 fL   MCH 33.1 26.0 - 34.0 pg   MCHC 32.8 30.0 - 36.0 g/dL   RDW 69.6 29.5 - 28.4 %   Platelets 299 150 - 400 K/uL   nRBC 0.0 0.0 - 0.2 %   Neutrophils Relative % 60 %   Neutro Abs 3.2 1.7 - 7.7 K/uL   Lymphocytes Relative 26 %   Lymphs Abs 1.4 0.7 - 4.0 K/uL   Monocytes Relative 11 %   Monocytes Absolute 0.6 0.1 - 1.0 K/uL   Eosinophils Relative 1 %   Eosinophils Absolute 0.1 0.0 - 0.5 K/uL   Basophils Relative 1 %   Basophils Absolute 0.0 0.0  - 0.1 K/uL   Immature Granulocytes 1 %   Abs Immature Granulocytes 0.04 0.00 - 0.07 K/uL    Comment: Performed at [facility], [address] [place], [place] [address]  Comprehensive metabolic panel     Status: Abnormal   Collection Time: 10/31/22  2:52 PM  Result Value Ref Range   Sodium 140 135 - 145 mmol/L   Potassium 3.7 3.5 - 5.1 mmol/L   Chloride 107 98 - 111 mmol/L   CO2 22 22 - 32 mmol/L   Glucose, Bld 95 70 - 99 mg/dL    Comment: Glucose reference range applies only to samples taken after fasting for at least 8 hours.   BUN 8 6 - 20 mg/dL   Creatinine, Ser 0.10 0.61 - 1.24 mg/dL   Calcium 9.0 8.9 - 27.2 mg/dL   Total Protein 7.6 6.5 - 8.1 g/dL   Albumin 4.2 3.5 - 5.0 g/dL   AST 51 (H) 15 - 41 U/L   ALT 54 (H) 0 - 44 U/L   Alkaline Phosphatase 33 (L) 38 - 126 U/L   Total Bilirubin 0.4 0.3 - 1.2 mg/dL   GFR, Estimated >53 >66  mL/min    Comment: (NOTE) Calculated using the CKD-EPI Creatinine Equation (2021)    Anion gap 11 5 - 15    Comment: Performed at [facility], [address], [place], [place] [address]  Lactic acid, plasma     Status: None   Collection Time: 10/31/22  2:52 PM  Result Value Ref Range   Lactic Acid, Venous 1.1 0.5 - 1.9 mmol/L    Comment: Performed at [facility], [address], [place], [place] [address]  Lactic acid, plasma     Status: None   Collection Time: 10/31/22  3:35 PM  Result Value Ref Range   Lactic Acid, Venous 1.1 0.5 - 1.9 mmol/L    Comment: Performed at [facility], [address], [place], [place] [address]  Blood culture (routine x 2)     Status: None (Preliminary result)   Collection Time: 10/31/22  7:18 PM   Specimen: BLOOD  Result Value Ref Range   Specimen Description      BLOOD BLOOD LEFT HAND Performed at [facility], [address] [place], [place] [address]    Special Requests      BOTTLES DRAWN AEROBIC AND  ANAEROBIC Blood Culture adequate volume Performed at [facility], [address], [place], [place] [address]    Culture      NO GROWTH < 12 HOURS Performed at [facility], [address], [place], [place] [address]    Report Status PENDING   Blood culture (routine x 2)     Status: None (Preliminary result)   Collection Time: 10/31/22  7:19 PM   Specimen: BLOOD  Result Value Ref Range   Specimen Description      BLOOD BLOOD RIGHT FOREARM Performed at [facility], [address] [place], [place] [address]    Special Requests      BOTTLES DRAWN AEROBIC ONLY Blood Culture adequate volume Performed at [facility], [address], [place], [place] [address]    Culture      NO GROWTH < 12 HOURS Performed at [facility], [address] [place], [place] [address]    Report Status PENDING   HIV Antibody (routine testing w rflx)     Status: None   Collection Time: 11/01/22  3:29 AM  Result Value Ref Range   HIV Screen 4th Generation wRfx Non Reactive Non Reactive    Comment: Performed at [facility], [address], [place], [place] [address]  CBC     Status: Abnormal   Collection Time: 11/01/22  3:29 AM  Result Value Ref Range   WBC 4.0 4.0 - 10.5 K/uL   RBC 4.49 4.22 - 5.81 MIL/uL   Hemoglobin 14.7 13.0 - 17.0 g/dL   HCT 57.3 22.0 - 25.4 %   MCV 101.6 (H) 80.0 - 100.0 fL   MCH 32.7 26.0 - 34.0 pg   MCHC 32.2 30.0 - 36.0 g/dL   RDW 27.0 62.3 - 76.2 %   Platelets 218 150 - 400 K/uL   nRBC 0.0 0.0 - 0.2 %    Comment: Performed at [facility], [address], [place], [place] [address]  Basic metabolic panel     Status: None   Collection Time: 11/01/22  3:29 AM  Result Value Ref Range   Sodium 137 135 - 145 mmol/L   Potassium 3.9 3.5 - 5.1 mmol/L   Chloride  107 98 - 111 mmol/L   CO2 24 22 - 32 mmol/L   Glucose, Bld 96 70 - 99 mg/dL    Comment: Glucose reference range applies  only to samples taken after fasting for at least 8 hours.   BUN 10 6 - 20 mg/dL   Creatinine, Ser 5.63 0.61 - 1.24 mg/dL   Calcium 9.0 8.9 - 87.5 mg/dL   GFR, Estimated >64 >33 mL/min    Comment: (NOTE) Calculated using the CKD-EPI Creatinine Equation (2021)    Anion gap 6 5 - 15    Comment: Performed at [facility], [address], [place], [place] [address]    MR HAND LEFT W WO CONTRAST  Result Date: 10/31/2022 CLINICAL DATA:  Osteomyelitis suspected.  Hand laceration. EXAM: MRI OF THE LEFT HAND WITHOUT AND WITH CONTRAST TECHNIQUE: Multiplanar, multisequence MR imaging of the left hand was performed before and after the administration of intravenous contrast. CONTRAST:  8mL GADAVIST GADOBUTROL 1 MMOL/ML IV SOLN COMPARISON:  None Available. FINDINGS: Bones/Joint/Cartilage Marrow signal is within normal limits. No evidence of osteomyelitis. No fracture or dislocation. Normal alignment. No joint effusion. No erosive changes. No periostitis. Ligaments Collateral ligaments are intact. Muscles and Tendons Flexor and extensor compartment tendons are intact. Muscles are normal. Soft tissue There is heterogeneous enhancing collection about the volar aspect of the first metacarpophalangeal joint with skin irregularity measuring at least 2.0 x 2.9 x 3.0 cm. There is small amount of fluid collection in the subcutaneous area with peripheral enhancement measuring up to 1 cm. No soft tissue mass. IMPRESSION: 1. No evidence of osteomyelitis. 2. Heterogeneous enhancing collection about the volar aspect of the first metacarpophalangeal joint with skin irregularity measuring at least 2.0 x 2.9 x 3.0 cm concerning for abscess formation. Electronically Signed   By: Larose Hires D.O.   On: 10/31/2022 21:50   DG Hand Complete Left  Result Date: 10/31/2022 CLINICAL DATA:  Left hand swelling status post sutures. Question infection. Patient cut hand 10/25/2022. Handouts swollen. Question infected.  Bleeding last 4 days. EXAM: LEFT HAND - COMPLETE 3+ VIEW COMPARISON:  Left wrist radiographs 09/20/2022 FINDINGS: On the comparison radiographs 09/20/2022, there were superficial lacerations of the soft tissues overlying the distal ulnar diaphysis. The lucent lacerations are no longer visualized, however there is now mild-to-moderate soft tissue swelling in this region, and a 3 mm peripherally calcified and centrally lucent density within the region of soft tissue swelling. There is very mild possible periosteal reaction at the adjacent distal ulnar diaphysis. Joint spaces are preserved. IMPRESSION: In the region of the prior lacerations on the 09/20/2022 radiographs, there is now mild-to-moderate soft tissue swelling overlying the medial aspect of the distal ulnar diaphysis. Within the region of the soft tissue swelling there is a 3 mm calcific density and very mild possible periosteal reaction at the adjacent distal ulnar diaphysis. This appearance may be secondary to calcifications from soft tissue healing. It is difficult to exclude very early periostitis as can be seen with cellulitis/adjacent early osteomyelitis. Electronically Signed   By: Neita Garnet M.D.   On: 10/31/2022 15:44    Review of Systems  Constitutional:  Negative for chills, diaphoresis and fever.  HENT:  Negative for ear discharge, ear pain, hearing loss and tinnitus.   Eyes:  Negative for photophobia and pain.  Respiratory:  Negative for cough and shortness of breath.   Cardiovascular:  Negative for chest pain.  Gastrointestinal:  Negative for abdominal pain, nausea and vomiting.  Genitourinary:  Negative for dysuria, flank pain, frequency and urgency.  Musculoskeletal:  Positive for arthralgias (Left hand). Negative for back pain, myalgias and neck pain.  Neurological:  Negative for dizziness and headaches.  Hematological:  Does not bruise/bleed easily.  Psychiatric/Behavioral:  The patient is not nervous/anxious.    Blood  pressure 130/80, pulse 79, temperature 98.3 F (36.8 C), temperature source Oral, resp. rate 20, height 5\' 5"  (1.651 m), weight 78 kg, SpO2 100%. Physical Exam Constitutional:      General: He is not in acute distress.    Appearance: He is well-developed. He is not diaphoretic.  HENT:     Head: Normocephalic and atraumatic.  Eyes:     General: No scleral icterus.       Right eye: No discharge.        Left eye: No discharge.     Conjunctiva/sclera: Conjunctivae normal.  Cardiovascular:     Rate and Rhythm: Normal rate and regular rhythm.  Pulmonary:     Effort: Pulmonary effort is normal. No respiratory distress.  Musculoskeletal:     Cervical back: Normal range of motion.     Comments: Left shoulder, elbow, wrist, digits- Laceration at base of thumb volar, surrounding tissue soft, mild TTP, no instability, no blocks to motion  Sens  Ax/R/M/U intact, tingling at tip of thumb  Mot   Ax/ R/ PIN/ M/ AIN/ U intact  Rad 2+  Skin:    General: Skin is warm and dry.  Neurological:     Mental Status: He is alert.  Psychiatric:        Mood and Affect: Mood normal.        Behavior: Behavior normal.     Assessment/Plan: Left hand infection -- It sounds and feels like he has spontaneously drained the abscess noted on MRI. Would advise medical treatment from here on with dry dressings and abx. He should f/u with [name] next week for wound check.    Freeman Caldron, PA-C Orthopedic Surgery [phone] 11/01/2022, 11:57 AM

## 2022-11-01 NOTE — Plan of Care (Signed)
Problem: Education: Goal: Knowledge of General Education information will improve Description: Including pain rating scale, medication(s)/side effects and non-pharmacologic comfort measures Outcome: Progressing   Problem: Clinical Measurements: Goal: Ability to maintain clinical measurements within normal limits will improve Outcome: Progressing   Problem: Activity: Goal: Risk for activity intolerance will decrease Outcome: Progressing   Problem: Safety: Goal: Ability to remain free from injury will improve Outcome: Progressing   Problem: Skin Integrity: Goal: Risk for impaired skin integrity will decrease Outcome: Progressing   Haydee Salter, RN 11/01/22 8:52 AM

## 2022-11-01 NOTE — Discharge Summary (Signed)
Physician Discharge Summary  Kent Graham RKY:[id number] DOB: [birth year] DOA: 10/31/2022  PCP: Pcp, No  Admit date: 10/31/2022 Discharge date: 11/01/2022  Time spent: 44 minutes  Recommendations for Outpatient Follow-up:  Needs outpatient follow-up with [name] in 1 week for wound check Work excuse given for 3 weeks until he can coordinate with orthopedics next steps and planning Complete antibiotics Dressings as below  Discharge Diagnoses:  MAIN problem for hospitalization   hand abscess after hand laceration status postrepair as below Chronic ethanolism  Please see below for itemized issues addressed in HOpsital- refer to other progress notes for clarity if needed  Discharge Condition: Improved  Diet recommendation: Heart healthy  Filed Weights   10/31/22 1344  Weight: 78 kg    History of present illness:  42 year old male known moderate EtOH tobacco abuse Seen initially 7/16 accidental pocket knife laceration of palm near volar aspect of first digit/thumb Had 12 sutures placed never received antibiotics (could not afford them) and came to the emergency room with swelling purulence white count of 5 BUNs/creatinine 22/8 slightly elevated LFTs and MRI showing 2X2.9X 3.0 cm area in the volar aspect of first metacarpal phalangeal joint concerning for abscess Orthopedics saw the patient after patient had been seen initially in the ED-the area was spontaneously draining and nonpurulent appearing with clean bases and margins Orthopedics felt patient could be discharged home to be followed in 1 week closely by Dr. [name] We will cover him with Augmentin and ensure that we call in the prescription to [facility] for the patient to pick up He has been counseled regarding drinking and smoking Match letter should be given to the patient to ensure that he can afford these meds and he will need to address them daily with dry gauze and some bacitracin until he  is followed by Dr. [name] He was stabilized for discharge as below   Discharge Exam: Vitals:   11/01/22 0537 11/01/22 0948  BP: 120/72 130/80  Pulse: 78 79  Resp: 17 20  Temp: 98.5 F (36.9 C) 98.3 F (36.8 C)  SpO2: 99% 100%    Subj on day of d/c   Coherent alert noted stress looks well feels well  General Exam on discharge  Hand exam of the left hand shows laceration which is deep with some crusted blood but the base looks clean with no purulence exuded Chest is clear no added sound ROM is intact although a little antalgic to the hand Abdomen is soft  Discharge Instructions   Discharge Instructions     Diet - low sodium heart healthy   Complete by: As directed    Discharge instructions   Complete by: As directed    Cover area well with dry gauze--you may use some bacitracin to coat the area of the hand but do not get the area wet until you follow-up with orthopedics people that you saw this morning and you need to be seen by them in about 2 weeks to make sure that the hand is healing up well You cannot do manual work for a little while such as heavy lifting or shoveling etc. I will write you a letter excusing you for at least 2 weeks from anything other than mowing the grass Finish all of the antibiotics that you have been prescribed Report high fever oozing of pus or worsening appearance of wound to the ED and come back immediately if you have a fever   Increase activity slowly  Complete by: As directed       Allergies as of 11/01/2022   No Known Allergies      Medication List     STOP taking these medications    cyclobenzaprine 5 MG tablet Commonly known as: FLEXERIL   HYDROcodone-acetaminophen 5-325 MG tablet Commonly known as: NORCO/VICODIN   indomethacin 50 MG capsule Commonly known as: INDOCIN   oseltamivir 75 MG capsule Commonly known as: TAMIFLU       TAKE these medications    albuterol 108 (90 Base) MCG/ACT inhaler Commonly known as:  VENTOLIN HFA Inhale 1-2 puffs into the lungs every 6 (six) hours as needed for wheezing or shortness of breath.   amoxicillin-clavulanate 875-125 MG tablet Commonly known as: AUGMENTIN Take 1 tablet by mouth every 12 (twelve) hours.   bacitracin ointment Apply topically 2 (two) times daily.   benzonatate 100 MG capsule Commonly known as: TESSALON Take 1 capsule (100 mg total) by mouth 3 (three) times daily as needed for cough.   diclofenac 75 MG EC tablet Commonly known as: VOLTAREN Take 1 tablet (75 mg total) by mouth 2 (two) times daily.   nicotine 21 mg/24hr patch Commonly known as: NICODERM CQ - dosed in mg/24 hours Place 1 patch (21 mg total) onto the skin daily. Start taking on: November 02, 2022   Nu Gauze 4ply 4"X4" Pads 1 each by Does not apply route daily.       No Known Allergies    The results of significant diagnostics from this hospitalization (including imaging, microbiology, ancillary and laboratory) are listed below for reference.    Significant Diagnostic Studies: MR HAND LEFT W WO CONTRAST  Result Date: 10/31/2022 CLINICAL DATA:  Osteomyelitis suspected.  Hand laceration. EXAM: MRI OF THE LEFT HAND WITHOUT AND WITH CONTRAST TECHNIQUE: Multiplanar, multisequence MR imaging of the left hand was performed before and after the administration of intravenous contrast. CONTRAST:  8mL GADAVIST GADOBUTROL 1 MMOL/ML IV SOLN COMPARISON:  None Available. FINDINGS: Bones/Joint/Cartilage Marrow signal is within normal limits. No evidence of osteomyelitis. No fracture or dislocation. Normal alignment. No joint effusion. No erosive changes. No periostitis. Ligaments Collateral ligaments are intact. Muscles and Tendons Flexor and extensor compartment tendons are intact. Muscles are normal. Soft tissue There is heterogeneous enhancing collection about the volar aspect of the first metacarpophalangeal joint with skin irregularity measuring at least 2.0 x 2.9 x 3.0 cm. There is small  amount of fluid collection in the subcutaneous area with peripheral enhancement measuring up to 1 cm. No soft tissue mass. IMPRESSION: 1. No evidence of osteomyelitis. 2. Heterogeneous enhancing collection about the volar aspect of the first metacarpophalangeal joint with skin irregularity measuring at least 2.0 x 2.9 x 3.0 cm concerning for abscess formation. Electronically Signed   By: Larose Hires D.O.   On: 10/31/2022 21:50   DG Hand Complete Left  Result Date: 10/31/2022 CLINICAL DATA:  Left hand swelling status post sutures. Question infection. Patient cut hand 10/25/2022. Handouts swollen. Question infected. Bleeding last 4 days. EXAM: LEFT HAND - COMPLETE 3+ VIEW COMPARISON:  Left wrist radiographs 09/20/2022 FINDINGS: On the comparison radiographs 09/20/2022, there were superficial lacerations of the soft tissues overlying the distal ulnar diaphysis. The lucent lacerations are no longer visualized, however there is now mild-to-moderate soft tissue swelling in this region, and a 3 mm peripherally calcified and centrally lucent density within the region of soft tissue swelling. There is very mild possible periosteal reaction at the adjacent distal ulnar diaphysis. Joint spaces are  preserved. IMPRESSION: In the region of the prior lacerations on the 09/20/2022 radiographs, there is now mild-to-moderate soft tissue swelling overlying the medial aspect of the distal ulnar diaphysis. Within the region of the soft tissue swelling there is a 3 mm calcific density and very mild possible periosteal reaction at the adjacent distal ulnar diaphysis. This appearance may be secondary to calcifications from soft tissue healing. It is difficult to exclude very early periostitis as can be seen with cellulitis/adjacent early osteomyelitis. Electronically Signed   By: Neita Garnet M.D.   On: 10/31/2022 15:44    Microbiology: Recent Results (from the past 240 hour(s))  Blood culture (routine x 2)     Status: None  (Preliminary result)   Collection Time: 10/31/22  7:18 PM   Specimen: BLOOD  Result Value Ref Range Status   Specimen Description   Final    BLOOD BLOOD LEFT HAND Performed at [facility], [address] [place], [place] [address]    Special Requests   Final    BOTTLES DRAWN AEROBIC AND ANAEROBIC Blood Culture adequate volume Performed at [facility], [address] [place], [place] [address]    Culture   Final    NO GROWTH < 12 HOURS Performed at [facility], [address], [place], [place] [address]    Report Status PENDING  Incomplete  Blood culture (routine x 2)     Status: None (Preliminary result)   Collection Time: 10/31/22  7:19 PM   Specimen: BLOOD  Result Value Ref Range Status   Specimen Description   Final    BLOOD BLOOD RIGHT FOREARM Performed at [facility], [address] [place], [place] [address]    Special Requests   Final    BOTTLES DRAWN AEROBIC ONLY Blood Culture adequate volume Performed at [facility], [address], [place], [place] [address]    Culture   Final    NO GROWTH < 12 HOURS Performed at [facility], [address] [place], [place] [address]    Report Status PENDING  Incomplete     Labs: Basic Metabolic Panel: Recent Labs  Lab 10/31/22 1452 11/01/22 0329  NA 140 137  K 3.7 3.9  CL 107 107  CO2 22 24  GLUCOSE 95 96  BUN 8 10  CREATININE 0.75 0.75  CALCIUM 9.0 9.0   Liver Function Tests: Recent Labs  Lab 10/31/22 1452  AST 51*  ALT 54*  ALKPHOS 33*  BILITOT 0.4  PROT 7.6  ALBUMIN 4.2   No results for input(s): "LIPASE", "AMYLASE" in the last 168 hours. No results for input(s): "AMMONIA" in the last 168 hours. CBC: Recent Labs  Lab 10/31/22 1452 11/01/22 0329  WBC 5.3 4.0  NEUTROABS 3.2  --   HGB 15.3 14.7  HCT 46.7 45.6  MCV 101.1* 101.6*  PLT 299 218   Cardiac Enzymes: No results for input(s): "CKTOTAL",  "CKMB", "CKMBINDEX", "TROPONINI" in the last 168 hours. BNP: BNP (last 3 results) No results for input(s): "BNP" in the last 8760 hours.  ProBNP (last 3 results) No results for input(s): "PROBNP" in the last 8760 hours.  CBG: No results for input(s): "GLUCAP" in the last 168 hours.     Signed:  Rhetta Mura MD   [facility] 11/01/2022, 1:44 PM

## 2022-11-02 ENCOUNTER — Other Ambulatory Visit (HOSPITAL_COMMUNITY): Payer: Self-pay

## 2022-11-02 DIAGNOSIS — L089 Local infection of the skin and subcutaneous tissue, unspecified: Secondary | ICD-10-CM | POA: Diagnosis not present

## 2022-11-02 LAB — CULTURE, BLOOD (ROUTINE X 2)

## 2022-11-02 MED ORDER — DIPHENHYDRAMINE-ZINC ACETATE 2-0.1 % EX CREA
TOPICAL_CREAM | Freq: Three times a day (TID) | CUTANEOUS | Status: DC | PRN
  Filled 2022-11-02: qty 28

## 2022-11-02 MED ORDER — DIPHENHYDRAMINE HCL 25 MG PO CAPS: 25.0000 mg | ORAL_CAPSULE | Freq: Three times a day (TID) | ORAL | Status: DC | PRN

## 2022-11-02 MED ORDER — HYDROXYZINE HCL 25 MG PO TABS
25.0000 mg | ORAL_TABLET | Freq: Once | ORAL | Status: AC
  Administered 2022-11-02: 25 mg via ORAL
  Filled 2022-11-02: qty 1

## 2022-11-02 MED ORDER — DIPHENHYDRAMINE-ZINC ACETATE 2-0.1 % EX CREA
TOPICAL_CREAM | Freq: Three times a day (TID) | CUTANEOUS | 0 refills | Status: AC | PRN
  Filled 2022-11-02: qty 28.4, fill #0

## 2022-11-02 NOTE — Plan of Care (Signed)

## 2022-11-03 ENCOUNTER — Other Ambulatory Visit (HOSPITAL_COMMUNITY): Payer: Self-pay

## 2022-11-03 LAB — CULTURE, BLOOD (ROUTINE X 2)
Culture: NO GROWTH
Special Requests: ADEQUATE
Special Requests: ADEQUATE

## 2022-11-11 ENCOUNTER — Encounter (HOSPITAL_COMMUNITY): Payer: Self-pay

## 2022-11-11 ENCOUNTER — Inpatient Hospital Stay: Payer: Self-pay | Admitting: Nurse Practitioner

## 2022-11-11 ENCOUNTER — Emergency Department (HOSPITAL_COMMUNITY)
Admission: EM | Admit: 2022-11-11 | Discharge: 2022-11-12 | Disposition: A | Discharge status: Home / Self Care | Payer: 59 | Attending: Emergency Medicine | Admitting: Emergency Medicine

## 2022-11-11 ENCOUNTER — Emergency Department (HOSPITAL_COMMUNITY): Payer: 59

## 2022-11-11 ENCOUNTER — Other Ambulatory Visit: Payer: Self-pay

## 2022-11-11 DIAGNOSIS — L03114 Cellulitis of left upper limb: Secondary | ICD-10-CM | POA: Diagnosis present

## 2022-11-11 LAB — CBC WITH DIFFERENTIAL/PLATELET
Abs Immature Granulocytes: 0.02 10*3/uL (ref 0.00–0.07)
Basophils Absolute: 0 10*3/uL (ref 0.0–0.1)
Basophils Relative: 1 %
Eosinophils Absolute: 0.1 10*3/uL (ref 0.0–0.5)
Eosinophils Relative: 2 %
HCT: 44.8 % (ref 39.0–52.0)
Hemoglobin: 14.6 g/dL (ref 13.0–17.0)
Immature Granulocytes: 0 %
Lymphocytes Relative: 28 %
Lymphs Abs: 1.5 10*3/uL (ref 0.7–4.0)
MCH: 33 pg (ref 26.0–34.0)
MCHC: 32.6 g/dL (ref 30.0–36.0)
MCV: 101.4 fL — ABNORMAL HIGH (ref 80.0–100.0)
Monocytes Absolute: 0.5 10*3/uL (ref 0.1–1.0)
Monocytes Relative: 9 %
Neutro Abs: 3.2 10*3/uL (ref 1.7–7.7)
Neutrophils Relative %: 60 %
Platelets: 260 10*3/uL (ref 150–400)
RBC: 4.42 MIL/uL (ref 4.22–5.81)
RDW: 12.7 % (ref 11.5–15.5)
WBC: 5.3 10*3/uL (ref 4.0–10.5)
nRBC: 0 % (ref 0.0–0.2)

## 2022-11-11 LAB — COMPREHENSIVE METABOLIC PANEL
ALT: 50 U/L — ABNORMAL HIGH (ref 0–44)
AST: 39 U/L (ref 15–41)
Albumin: 4.2 g/dL (ref 3.5–5.0)
Alkaline Phosphatase: 44 U/L (ref 38–126)
Anion gap: 12 (ref 5–15)
BUN: 9 mg/dL (ref 6–20)
CO2: 22 mmol/L (ref 22–32)
Calcium: 8.7 mg/dL — ABNORMAL LOW (ref 8.9–10.3)
Chloride: 107 mmol/L (ref 98–111)
Creatinine, Ser: 0.92 mg/dL (ref 0.61–1.24)
GFR, Estimated: >60 mL/min (ref >60)
Glucose, Bld: 126 mg/dL — ABNORMAL HIGH (ref 70–99)
Potassium: 3.5 mmol/L (ref 3.5–5.1)
Sodium: 141 mmol/L (ref 135–145)
Total Bilirubin: 0.2 mg/dL — ABNORMAL LOW (ref 0.3–1.2)
Total Protein: 7.9 g/dL (ref 6.5–8.1)

## 2022-11-11 MED ORDER — GADOPICLENOL 0.5 MMOL/ML IV SOLN
7.5000 mL | Freq: Once | INTRAVENOUS | Status: AC | PRN
  Administered 2022-11-11: 7.5 mL via INTRAVENOUS

## 2022-11-11 MED ORDER — SODIUM CHLORIDE 0.9 % IV BOLUS
1000.0000 mL | Freq: Once | INTRAVENOUS | Status: AC
  Administered 2022-11-11: 1000 mL via INTRAVENOUS

## 2022-11-11 MED ORDER — VANCOMYCIN HCL IN DEXTROSE 1-5 GM/200ML-% IV SOLN: 1000.0000 mg | Freq: Once | INTRAVENOUS | Status: DC

## 2022-11-11 MED ORDER — OXYCODONE-ACETAMINOPHEN 5-325 MG PO TABS
2.0000 | ORAL_TABLET | Freq: Once | ORAL | Status: AC
  Administered 2022-11-11: 2 via ORAL
  Filled 2022-11-11: qty 2

## 2022-11-11 MED ORDER — VANCOMYCIN HCL 1500 MG/300ML IV SOLN
1500.0000 mg | Freq: Once | INTRAVENOUS | Status: AC
  Administered 2022-11-11: 1500 mg via INTRAVENOUS
  Filled 2022-11-11: qty 300

## 2022-11-11 MED ORDER — MORPHINE SULFATE (PF) 4 MG/ML IV SOLN
4.0000 mg | Freq: Once | INTRAVENOUS | Status: AC
  Administered 2022-11-11: 4 mg via INTRAVENOUS
  Filled 2022-11-11: qty 1

## 2022-11-11 MED ORDER — OXYCODONE-ACETAMINOPHEN 5-325 MG PO TABS
1.0000 | ORAL_TABLET | Freq: Four times a day (QID) | ORAL | 0 refills | Status: AC | PRN
  Filled 2022-11-11: qty 10, 3d supply, fill #0

## 2022-11-11 MED ORDER — CLINDAMYCIN HCL 300 MG PO CAPS
300.0000 mg | ORAL_CAPSULE | Freq: Three times a day (TID) | ORAL | 0 refills | Status: DC
  Filled 2022-11-11: qty 21, 7d supply, fill #0

## 2022-11-11 NOTE — ED Provider Notes (Signed)
[place] EMERGENCY DEPARTMENT AT [facility] Provider Note   CSN: [id number] Arrival date & time: 11/11/22  1510     History  Chief Complaint  Patient presents with   Wound Infection    Kent Graham is a 42 y.o. male here presenting with possible wound infection.  Patient states that he had laceration of the left hand and sutures were placed.  Patient states that he was not healing well.  Patient then came back about a week ago.  Patient had an MRI that showed an abscess at that time.  Patient was admitted for IV antibiotics.  Patient states that he finished course of Augmentin.  He states that he has persistent purulent discharge from the wound.  He also feels numb around that area.  The history is provided by the patient.       Home Medications Prior to Admission medications   Medication Sig Start Date End Date Taking? Authorizing Provider  amoxicillin-clavulanate (AUGMENTIN) 875-125 MG tablet Take 1 tablet by mouth every 12 hours. 11/01/22   Rhetta Mura, MD  bacitracin ointment Apply topically 2 (two) times daily. 11/01/22   Rhetta Mura, MD  diclofenac (VOLTAREN) 75 MG EC tablet Take 1 tablet (75 mg) by mouth 2 times daily. 11/01/22   Rhetta Mura, MD  diphenhydrAMINE-zinc acetate (BENADRYL) cream Apply topically 3 (three) times daily as needed for itching. 11/02/22   Kathlen Mody, MD  Gauze Pads & Dressings (NU GAUZE 4PLY) 4"X4" PADS use daily. 11/01/22   Rhetta Mura, MD  nicotine (NICODERM CQ - DOSED IN MG/24 HOURS) 21 mg/24hr patch Place 1 patch (21 mg) onto the skin daily. 11/02/22   Rhetta Mura, MD      Allergies    Patient has no known allergies.    Review of Systems   Review of Systems  Skin:  Positive for wound.  All other systems reviewed and are negative.   Physical Exam Updated Vital Signs BP (!) 142/98 (BP Location: Left Arm)   Pulse 69   Temp 98 F (36.7 C) (Oral)   Resp 16   Ht 5\' 11"  (1.803 m)    Wt 77.1 kg   SpO2 97%   BMI 23.71 kg/m  Physical Exam Vitals and nursing note reviewed.  Constitutional:      Appearance: Normal appearance.  HENT:     Head: Normocephalic.     Nose: Nose normal.     Mouth/Throat:     Mouth: Mucous membranes are moist.  Eyes:     Extraocular Movements: Extraocular movements intact.     Pupils: Pupils are equal, round, and reactive to light.  Cardiovascular:     Rate and Rhythm: Normal rate and regular rhythm.     Pulses: Normal pulses.     Heart sounds: Normal heart sounds.  Pulmonary:     Effort: Pulmonary effort is normal.     Breath sounds: Normal breath sounds.  Abdominal:     General: Abdomen is flat.     Palpations: Abdomen is soft.  Musculoskeletal:     Cervical back: Normal range of motion and neck supple.     Comments: Patient has wound in the left first webspace.  Patient does have some discharge from that area.  Patient is able to flex and extend the thumb.  Patient has normal capillary refill  Neurological:     General: No focal deficit present.     Mental Status: He is alert and oriented to person, place, and time.  Psychiatric:        Mood and Affect: Mood normal.        Behavior: Behavior normal.     ED Results / Procedures / Treatments   Labs (all labs ordered are listed, but only abnormal results are displayed) Labs Reviewed  COMPREHENSIVE METABOLIC PANEL - Abnormal; Notable for the following components:      Result Value   Glucose, Bld 126 (*)    Calcium 8.7 (*)    ALT 50 (*)    Total Bilirubin 0.2 (*)    All other components within normal limits  CBC WITH DIFFERENTIAL/PLATELET - Abnormal; Notable for the following components:   MCV 101.4 (*)    All other components within normal limits  URINALYSIS, ROUTINE W REFLEX MICROSCOPIC  SEDIMENTATION RATE  C-REACTIVE PROTEIN  I-STAT CG4 LACTIC ACID, ED  I-STAT CG4 LACTIC ACID, ED    EKG None  Radiology No results found.  Procedures Procedures     Medications Ordered in ED Medications  morphine (PF) 4 MG/ML injection 4 mg (has no administration in time range)  sodium chloride 0.9 % bolus 1,000 mL (has no administration in time range)  vancomycin (VANCOREADY) IVPB 1500 mg/300 mL (has no administration in time range)    ED Course/ Medical Decision Making/ A&P                                 Medical Decision Making [name] is a 42 y.o. male here presenting with persistent wound in the left hand.  Consider left hand osteomyelitis or persistently draining wound or abscess.  Plan to get CBC and BMP and MRI of the left hand.  10:01 PM Patient's WBC is normal. MRI showed no osteomyelitis or abscess. Given IV vancomycin in the ED.  Patient does not need any emergent intervention currently.  I will change his antibiotics to clindamycin.  Patient states that he will likely need financial help to get it.  Will leave a message with case management to see if patient can get match program.  11:04 PM I talked to Almyra from case management.  She states that patient can get match for abx and pain meds at [facility] outpatient pharmacy. Stable for discharge   Problems Addressed: Cellulitis of left upper extremity: acute illness or injury  Amount and/or Complexity of Data Reviewed Labs: ordered. Decision-making details documented in ED Course. Radiology: ordered and independent interpretation performed. Decision-making details documented in ED Course.  Risk Prescription drug management.    Final Clinical Impression(s) / ED Diagnoses Final diagnoses:  None    Rx / DC Orders ED Discharge Orders     None         Charlynne Pander, MD 11/11/22 2305

## 2022-11-11 NOTE — ED Triage Notes (Signed)
Pt was seen recently for left hand laceration and infection. He received dissolvable stiches, and antibiotics. Since then the stiches have dissolved, but the wound is still open, and the pt states he feels weak in that extremity.

## 2022-11-11 NOTE — ED Notes (Signed)
Unable to draw blood from the IV. Patient requests to wait before being stuck again. Will try again later.

## 2022-11-11 NOTE — ED Notes (Signed)
Gave patient food and a gingerale.

## 2022-11-11 NOTE — Discharge Instructions (Addendum)
As we discussed, you have cellulitis of the left hand  You need to take clindamycin 3 times a day for a week for cellulitis  You can take Percocet as needed for pain  Come to [facility] outpatient pharmacy tomorrow to pick up your meds. Tell them that you have MATCH program to get them there   Please call Dr. Debby Bud office for appointment for follow up   Return to ER if you have worse hand pain or purulent discharge or fever

## 2022-11-11 NOTE — ED Notes (Signed)
Attempted to place and IV, draw blood and give medication. Patient was not in the room. Will try again later.

## 2022-11-12 ENCOUNTER — Other Ambulatory Visit (HOSPITAL_COMMUNITY): Payer: Self-pay

## 2022-11-21 ENCOUNTER — Other Ambulatory Visit (HOSPITAL_COMMUNITY): Payer: Self-pay

## 2023-01-02 ENCOUNTER — Other Ambulatory Visit: Payer: Self-pay

## 2023-01-02 ENCOUNTER — Emergency Department (HOSPITAL_COMMUNITY)
Admission: EM | Admit: 2023-01-02 | Discharge: 2023-01-03 | Disposition: A | Discharge status: Home / Self Care | Payer: 59 | Attending: Emergency Medicine | Admitting: Emergency Medicine

## 2023-01-02 ENCOUNTER — Encounter (HOSPITAL_COMMUNITY): Payer: Self-pay

## 2023-01-02 ENCOUNTER — Emergency Department (HOSPITAL_COMMUNITY): Payer: 59

## 2023-01-02 DIAGNOSIS — M79604 Pain in right leg: Secondary | ICD-10-CM

## 2023-01-02 DIAGNOSIS — M79661 Pain in right lower leg: Secondary | ICD-10-CM | POA: Insufficient documentation

## 2023-01-02 DIAGNOSIS — M25561 Pain in right knee: Secondary | ICD-10-CM | POA: Diagnosis not present

## 2023-01-02 MED ORDER — MELOXICAM 15 MG PO TABS: 15.0000 mg | ORAL_TABLET | Freq: Every day | ORAL | 0 refills | Status: AC

## 2023-01-02 MED ORDER — KETOROLAC TROMETHAMINE 30 MG/ML IJ SOLN
30.0000 mg | Freq: Once | INTRAMUSCULAR | Status: AC
  Administered 2023-01-02: 30 mg via INTRAMUSCULAR
  Filled 2023-01-02: qty 1

## 2023-01-02 NOTE — Discharge Instructions (Addendum)
Your images today show no fracture. Please use the ACE wrap and crutches. Ice your knee intermittently throughout the day. I have prescribed antiinflammatory medications to be taken as directed. Follow up with orthopedics for further evaluation

## 2023-01-02 NOTE — ED Provider Notes (Incomplete)
[facility] AT [facility] Provider Note   CSN: [id number] Arrival date & time: 01/02/23  1508     History {Add pertinent medical, surgical, social history, OB history to HPI:1} Chief Complaint  Patient presents with  . Rt Leg Injury    KYNDELL HEWARD is a 42 y.o. male.  Patient presents to the emergency department complaining of right lower extremity pain which began approximately 2 days ago.  He states there was a snake near his foot which he stopped attempting to kill.  He states he stopped very hard at the time and began to have pain in the right leg.  He continues to complain of pain from the right hip down to the right ankle.  He states he has pain in the leg which creates difficulty with ambulation.  He rates his pain 10 out of 10 in severity.  He does endorse falling on the right side at the time of the incident but denies hitting his head.  Past medical history significant for scoliosis  HPI     Home Medications Prior to Admission medications   Medication Sig Start Date End Date Taking? Authorizing Provider  amoxicillin-clavulanate (AUGMENTIN) 875-125 MG tablet Take 1 tablet by mouth every 12 hours. 11/01/22   Rhetta Mura, MD  bacitracin ointment Apply topically 2 (two) times daily. 11/01/22   Rhetta Mura, MD  clindamycin (CLEOCIN) 300 MG capsule Take 1 capsule (300 mg total) by mouth 3 (three) times daily. 11/11/22   Charlynne Pander, MD  diclofenac (VOLTAREN) 75 MG EC tablet Take 1 tablet (75 mg) by mouth 2 times daily. 11/01/22   Rhetta Mura, MD  diphenhydrAMINE-zinc acetate (BENADRYL) cream Apply topically 3 (three) times daily as needed for itching. 11/02/22   Kathlen Mody, MD  Gauze Pads & Dressings (NU GAUZE 4PLY) 4"X4" PADS use daily. 11/01/22   Rhetta Mura, MD  nicotine (NICODERM CQ - DOSED IN MG/24 HOURS) 21 mg/24hr patch Place 1 patch (21 mg) onto the skin daily. 11/02/22   Rhetta Mura, MD   oxyCODONE-acetaminophen (PERCOCET) 5-325 MG tablet Take 1 tablet by mouth every 6 (six) hours as needed for moderate pain. 11/11/22   Charlynne Pander, MD      Allergies    Patient has no known allergies.    Review of Systems   Review of Systems  Physical Exam Updated Vital Signs BP 129/88 (BP Location: Left Arm)   Pulse 66   Temp 98.3 F (36.8 C)   Resp (!) 23   Ht 5\' 11"  (1.803 m)   Wt 79.4 kg   SpO2 100%   BMI 24.41 kg/m  Physical Exam Vitals and nursing note reviewed.  Constitutional:      General: He is not in acute distress.    Appearance: He is well-developed.  HENT:     Head: Normocephalic and atraumatic.  Cardiovascular:     Rate and Rhythm: Normal rate.  Pulmonary:     Effort: Pulmonary effort is normal. No respiratory distress.  Musculoskeletal:        General: Swelling present.     Cervical back: Neck supple.     Comments: Pain with significant swelling of the right knee.  Right knee with generalized tenderness to palpation.  Patient unable to move through normal range of motion due to pain.  No significant warmth or erythema  Skin:    General: Skin is warm and dry.     Capillary Refill: Capillary refill takes less than  2 seconds.  Neurological:     Mental Status: He is alert.     Sensory: No sensory deficit.     Motor: No weakness.  Psychiatric:        Mood and Affect: Mood normal.     ED Results / Procedures / Treatments   Labs (all labs ordered are listed, but only abnormal results are displayed) Labs Reviewed - No data to display  EKG None  Radiology DG FEMUR, MIN 2 VIEWS RIGHT  Result Date: 01/02/2023 CLINICAL DATA:  621308 Pain 144615 Right lower extremity pain and weakness.  Swelling. EXAM: RIGHT KNEE - 1-2 VIEW; RIGHT FEMUR 2 VIEWS; RIGHT TIBIA AND FIBULA - 2 VIEW COMPARISON:  None Available. FINDINGS: Femur: No acute fracture. The cortical margins of the femur are intact. Hip alignment is maintained. There is a nutrient channel in the  medial femoral shaft. No focal bone abnormality. Knee: No acute fracture or dislocation. The alignment and joint spaces are preserved. There is a moderate knee joint effusion but no definite lipohemarthrosis. No arthropathy. Tibia/fibula: No acute fracture. The cortical margins of the femur are intact. Ankle alignment is maintained. No focal bone abnormality. Unremarkable soft tissues. IMPRESSION: Moderate knee joint effusion. No acute osseous abnormality of the femur, knee, or tibia/fibula. Electronically Signed   By: Narda Rutherford M.D.   On: 01/02/2023 17:40   DG Tibia/Fibula Right  Result Date: 01/02/2023 CLINICAL DATA:  144615 Pain 144615 Right lower extremity pain and weakness.  Swelling. EXAM: RIGHT KNEE - 1-2 VIEW; RIGHT FEMUR 2 VIEWS; RIGHT TIBIA AND FIBULA - 2 VIEW COMPARISON:  None Available. FINDINGS: Femur: No acute fracture. The cortical margins of the femur are intact. Hip alignment is maintained. There is a nutrient channel in the medial femoral shaft. No focal bone abnormality. Knee: No acute fracture or dislocation. The alignment and joint spaces are preserved. There is a moderate knee joint effusion but no definite lipohemarthrosis. No arthropathy. Tibia/fibula: No acute fracture. The cortical margins of the femur are intact. Ankle alignment is maintained. No focal bone abnormality. Unremarkable soft tissues. IMPRESSION: Moderate knee joint effusion. No acute osseous abnormality of the femur, knee, or tibia/fibula. Electronically Signed   By: Narda Rutherford M.D.   On: 01/02/2023 17:40   DG Knee 1-2 Views Right  Result Date: 01/02/2023 CLINICAL DATA:  144615 Pain 144615 Right lower extremity pain and weakness.  Swelling. EXAM: RIGHT KNEE - 1-2 VIEW; RIGHT FEMUR 2 VIEWS; RIGHT TIBIA AND FIBULA - 2 VIEW COMPARISON:  None Available. FINDINGS: Femur: No acute fracture. The cortical margins of the femur are intact. Hip alignment is maintained. There is a nutrient channel in the medial  femoral shaft. No focal bone abnormality. Knee: No acute fracture or dislocation. The alignment and joint spaces are preserved. There is a moderate knee joint effusion but no definite lipohemarthrosis. No arthropathy. Tibia/fibula: No acute fracture. The cortical margins of the femur are intact. Ankle alignment is maintained. No focal bone abnormality. Unremarkable soft tissues. IMPRESSION: Moderate knee joint effusion. No acute osseous abnormality of the femur, knee, or tibia/fibula. Electronically Signed   By: Narda Rutherford M.D.   On: 01/02/2023 17:40    Procedures .Ortho Injury Treatment  Date/Time: 01/02/2023 11:56 PM  Performed by: Darrick Grinder, PA-C Authorized by: Darrick Grinder, PA-C   Consent:    Consent obtained:  Verbal   Consent given by:  Patient   Risks discussed:  Restricted joint movement, stiffness, vascular damage and nerve damage  Alternatives discussed:  No treatmentInjury location: knee Location details: right knee Injury type: soft tissue Pre-procedure neurovascular assessment: neurovascularly intact Immobilization: ACE bandage. Splint Applied by: Milon Dikes Supplies used: elastic bandage Post-procedure neurovascular assessment: post-procedure neurovascularly intact Comments: ACE wrap with crutches     {Document cardiac monitor, telemetry assessment procedure when appropriate:1}  Medications Ordered in ED Medications  ketorolac (TORADOL) 30 MG/ML injection 30 mg (30 mg Intramuscular Given 01/02/23 2349)    ED Course/ Medical Decision Making/ A&P   {   Click here for ABCD2, HEART and other calculatorsREFRESH Note before signing :1}                              Medical Decision Making Risk Prescription drug management.   This patient presents to the ED for concern of right lower extremity pain, this involves an extensive number of treatment options, and is a complaint that carries with it a high risk of complications and morbidity.  The  differential diagnosis includes fracture dislocation, soft tissue injury, others   Co morbidities that complicate the patient evaluation  Scoliosis  Imaging Studies ordered:  I ordered imaging studies including plain films of the right femur, knee, and tibia-fibula I independently visualized and interpreted imaging which showed  Moderate knee joint effusion. No acute osseous abnormality of the  femur, knee, or tibia/fibula.   I agree with the radiologist interpretation   Problem List / ED Course / Critical interventions / Medication management   I ordered medication including Toradol for inflammation Reevaluation of the patient after these medicines showed that the patient improved I have reviewed the patients home medicines and have made adjustments as needed   Social Determinants of Health:  Patient has no primary care provider   Test / Admission - Considered:  Patient with imaging concerning for right-sided knee effusion but no acute fracture or dislocation.  Patient placed in Ace wrap and provided with crutches for ambulation.  Plan to discharge home with plans for follow-up with orthopedic surgery.  Prescription for meloxicam provided.  Return precautions provided.   {Document critical care time when appropriate:1} {Document review of labs and clinical decision tools ie heart score, Chads2Vasc2 etc:1}  {Document your independent review of radiology images, and any outside records:1} {Document your discussion with family members, caretakers, and with consultants:1} {Document social determinants of health affecting pt's care:1} {Document your decision making why or why not admission, treatments were needed:1} Final Clinical Impression(s) / ED Diagnoses Final diagnoses:  Acute leg pain, right    Rx / DC Orders ED Discharge Orders     None

## 2023-01-02 NOTE — ED Provider Notes (Signed)
[place] EM[facility]GENCY DEPARTMENT AT [facility] Provider Note   CSN: [id number] Arrival date & time: 01/02/23  1508     History  Chief Complaint  Patient presents with   Rt Leg Injury    Kent Graham is a 42 y.o. male.  Patient presents to the emergency department complaining of right lower extremity pain which began approximately 2 days ago.  He states there was a snake near his foot which he stopped attempting to kill.  He states he stopped very hard at the time and began to have pain in the right leg.  He continues to complain of pain from the right hip down to the right ankle.  He states he has pain in the leg which creates difficulty with ambulation.  He rates his pain 10 out of 10 in severity.  He does endorse falling on the right side at the time of the incident but denies hitting his head.  Past medical history significant for scoliosis  HPI     Home Medications Prior to Admission medications   Medication Sig Start Date End Date Taking? Authorizing Provider  meloxicam (MOBIC) 15 MG tablet Take 1 tablet (15 mg total) by mouth daily. 01/02/23  Yes Barrie Dunker B, PA-C  amoxicillin-clavulanate (AUGMENTIN) 875-125 MG tablet Take 1 tablet by mouth every 12 hours. 11/01/22   Rhetta Mura, MD  bacitracin ointment Apply topically 2 (two) times daily. 11/01/22   Rhetta Mura, MD  clindamycin (CLEOCIN) 300 MG capsule Take 1 capsule (300 mg total) by mouth 3 (three) times daily. 11/11/22   Charlynne Pander, MD  diphenhydrAMINE-zinc acetate (BENADRYL) cream Apply topically 3 (three) times daily as needed for itching. 11/02/22   Kathlen Mody, MD  Gauze Pads & Dressings (NU GAUZE 4PLY) 4"X4" PADS use daily. 11/01/22   Rhetta Mura, MD  nicotine (NICODERM CQ - DOSED IN MG/24 HOURS) 21 mg/24hr patch Place 1 patch (21 mg) onto the skin daily. 11/02/22   Rhetta Mura, MD  oxyCODONE-acetaminophen (PERCOCET) 5-325 MG tablet Take 1 tablet by mouth every 6  (six) hours as needed for moderate pain. 11/11/22   Charlynne Pander, MD      Allergies    Patient has no known allergies.    Review of Systems   Review of Systems  Physical Exam Updated Vital Signs BP (!) 133/93   Pulse 70   Temp 98.1 F (36.7 C) (Oral)   Resp 20   Ht 5\' 11"  (1.803 m)   Wt 79.4 kg   SpO2 100%   BMI 24.41 kg/m  Physical Exam Vitals and nursing note reviewed.  Constitutional:      General: He is not in acute distress.    Appearance: He is well-developed.  HENT:     Head: Normocephalic and atraumatic.  Cardiovascular:     Rate and Rhythm: Normal rate.  Pulmonary:     Effort: Pulmonary effort is normal. No respiratory distress.  Musculoskeletal:        General: Swelling present.     Cervical back: Neck supple.     Comments: Pain with significant swelling of the right knee.  Right knee with generalized tenderness to palpation.  Patient unable to move through normal range of motion due to pain.  No significant warmth or erythema  Skin:    General: Skin is warm and dry.     Capillary Refill: Capillary refill takes less than 2 seconds.  Neurological:     Mental Status: He is alert.  Sensory: No sensory deficit.     Motor: No weakness.  Psychiatric:        Mood and Affect: Mood normal.     ED Results / Procedures / Treatments   Labs (all labs ordered are listed, but only abnormal results are displayed) Labs Reviewed - No data to display  EKG None  Radiology DG FEMUR, MIN 2 VIEWS RIGHT  Result Date: 01/02/2023 CLINICAL DATA:  161096 Pain 144615 Right lower extremity pain and weakness.  Swelling. EXAM: RIGHT KNEE - 1-2 VIEW; RIGHT FEMUR 2 VIEWS; RIGHT TIBIA AND FIBULA - 2 VIEW COMPARISON:  None Available. FINDINGS: Femur: No acute fracture. The cortical margins of the femur are intact. Hip alignment is maintained. There is a nutrient channel in the medial femoral shaft. No focal bone abnormality. Knee: No acute fracture or dislocation. The  alignment and joint spaces are preserved. There is a moderate knee joint effusion but no definite lipohemarthrosis. No arthropathy. Tibia/fibula: No acute fracture. The cortical margins of the femur are intact. Ankle alignment is maintained. No focal bone abnormality. Unremarkable soft tissues. IMPRESSION: Moderate knee joint effusion. No acute osseous abnormality of the femur, knee, or tibia/fibula. Electronically Signed   By: Narda Rutherford M.D.   On: 01/02/2023 17:40   DG Tibia/Fibula Right  Result Date: 01/02/2023 CLINICAL DATA:  144615 Pain 144615 Right lower extremity pain and weakness.  Swelling. EXAM: RIGHT KNEE - 1-2 VIEW; RIGHT FEMUR 2 VIEWS; RIGHT TIBIA AND FIBULA - 2 VIEW COMPARISON:  None Available. FINDINGS: Femur: No acute fracture. The cortical margins of the femur are intact. Hip alignment is maintained. There is a nutrient channel in the medial femoral shaft. No focal bone abnormality. Knee: No acute fracture or dislocation. The alignment and joint spaces are preserved. There is a moderate knee joint effusion but no definite lipohemarthrosis. No arthropathy. Tibia/fibula: No acute fracture. The cortical margins of the femur are intact. Ankle alignment is maintained. No focal bone abnormality. Unremarkable soft tissues. IMPRESSION: Moderate knee joint effusion. No acute osseous abnormality of the femur, knee, or tibia/fibula. Electronically Signed   By: Narda Rutherford M.D.   On: 01/02/2023 17:40   DG Knee 1-2 Views Right  Result Date: 01/02/2023 CLINICAL DATA:  144615 Pain 144615 Right lower extremity pain and weakness.  Swelling. EXAM: RIGHT KNEE - 1-2 VIEW; RIGHT FEMUR 2 VIEWS; RIGHT TIBIA AND FIBULA - 2 VIEW COMPARISON:  None Available. FINDINGS: Femur: No acute fracture. The cortical margins of the femur are intact. Hip alignment is maintained. There is a nutrient channel in the medial femoral shaft. No focal bone abnormality. Knee: No acute fracture or dislocation. The alignment and  joint spaces are preserved. There is a moderate knee joint effusion but no definite lipohemarthrosis. No arthropathy. Tibia/fibula: No acute fracture. The cortical margins of the femur are intact. Ankle alignment is maintained. No focal bone abnormality. Unremarkable soft tissues. IMPRESSION: Moderate knee joint effusion. No acute osseous abnormality of the femur, knee, or tibia/fibula. Electronically Signed   By: Narda Rutherford M.D.   On: 01/02/2023 17:40    Procedures .Ortho Injury Treatment  Date/Time: 01/02/2023 11:56 PM  Performed by: Darrick Grinder, PA-C Authorized by: Darrick Grinder, PA-C   Consent:    Consent obtained:  Verbal   Consent given by:  Patient   Risks discussed:  Restricted joint movement, stiffness, vascular damage and nerve damage   Alternatives discussed:  No treatmentInjury location: knee Location details: right knee Injury type: soft tissue Pre-procedure neurovascular  assessment: neurovascularly intact Immobilization: ACE bandage. Splint Applied by: Milon Dikes Supplies used: elastic bandage Post-procedure neurovascular assessment: post-procedure neurovascularly intact Comments: ACE wrap with crutches       Medications Ordered in ED Medications  ketorolac (TORADOL) 30 MG/ML injection 30 mg (30 mg Intramuscular Given 01/02/23 2349)    ED Course/ Medical Decision Making/ A&P                                 Medical Decision Making Risk Prescription drug management.   This patient presents to the ED for concern of right lower extremity pain, this involves an extensive number of treatment options, and is a complaint that carries with it a high risk of complications and morbidity.  The differential diagnosis includes fracture dislocation, soft tissue injury, others   Co morbidities that complicate the patient evaluation  Scoliosis  Imaging Studies ordered:  I ordered imaging studies including plain films of the right femur, knee, and  tibia-fibula I independently visualized and interpreted imaging which showed  Moderate knee joint effusion. No acute osseous abnormality of the  femur, knee, or tibia/fibula.   I agree with the radiologist interpretation   Problem List / ED Course / Critical interventions / Medication management   I ordered medication including Toradol for inflammation Reevaluation of the patient after these medicines showed that the patient improved I have reviewed the patients home medicines and have made adjustments as needed   Social Determinants of Health:  Patient has no primary care provider   Test / Admission - Considered:  Patient with imaging concerning for right-sided knee effusion but no acute fracture or dislocation.  Patient placed in Ace wrap and provided with crutches for ambulation.  Plan to discharge home with plans for follow-up with orthopedic surgery.  Prescription for meloxicam provided.  Return precautions provided.         Final Clinical Impression(s) / ED Diagnoses Final diagnoses:  Acute leg pain, right    Rx / DC Orders ED Discharge Orders          Ordered    meloxicam (MOBIC) 15 MG tablet  Daily        01/02/23 2359              Pamala Duffel 01/03/23 0050    Ernie Avena, MD 01/03/23 2200

## 2023-01-02 NOTE — ED Triage Notes (Signed)
Pt came in via POV d/t 2 days a copperhead snake was crawling around his foot & when he stomped his foot to kill  it so it would not bite him he stomped very hard, hurting his Rt leg, instantly feeling pain& weakness in his Rt leg, then falling on his Rt side. Now feels pain from his Rt hip down to his feet post stomping/falling. A/Ox4, rates his pain 10/10. Rt leg appears swollen from thigh to calf while in triage.

## 2023-01-03 DIAGNOSIS — M79661 Pain in right lower leg: Secondary | ICD-10-CM | POA: Diagnosis not present

## 2023-01-03 NOTE — ED Notes (Signed)
Pt in NAD at d/c from ED. A&O. Ambulatory. Respirations even & unlabored. Skin warm & dry. Discharged with crutches and ace wrap and instructed on use. Pt verbalized understanding of d/c teaching including follow up care, medication and reasons to return to the ED. Discharged with cab voucher.

## 2023-01-03 NOTE — Progress Notes (Signed)
Orthopedic Tech Progress Note Patient Details:  Kent Graham [birth year] [id number]  Ortho Devices Type of Ortho Device: Crutches, Ace wrap Ortho Device/Splint Location: rle Ortho Device/Splint Interventions: Ordered, Application, Adjustment  Rle knee ace wrap Post Interventions Patient Tolerated: Well Instructions Provided: Care of device, Adjustment of device  Trinna Post 01/03/2023, 12:04 AM

## 2023-01-19 ENCOUNTER — Emergency Department (HOSPITAL_COMMUNITY): Payer: 59

## 2023-01-19 ENCOUNTER — Encounter (HOSPITAL_COMMUNITY): Payer: Self-pay

## 2023-01-19 ENCOUNTER — Other Ambulatory Visit: Payer: Self-pay

## 2023-01-19 ENCOUNTER — Emergency Department (HOSPITAL_COMMUNITY)
Admission: EM | Admit: 2023-01-19 | Discharge: 2023-01-19 | Disposition: A | Discharge status: Home / Self Care | Payer: 59 | Attending: Emergency Medicine | Admitting: Emergency Medicine

## 2023-01-19 DIAGNOSIS — M25561 Pain in right knee: Secondary | ICD-10-CM | POA: Insufficient documentation

## 2023-01-19 DIAGNOSIS — Y907 Blood alcohol level of 200-239 mg/100 ml: Secondary | ICD-10-CM | POA: Insufficient documentation

## 2023-01-19 DIAGNOSIS — M79601 Pain in right arm: Secondary | ICD-10-CM | POA: Diagnosis not present

## 2023-01-19 DIAGNOSIS — F10920 Alcohol use, unspecified with intoxication, uncomplicated: Secondary | ICD-10-CM | POA: Insufficient documentation

## 2023-01-19 DIAGNOSIS — M79602 Pain in left arm: Secondary | ICD-10-CM | POA: Diagnosis not present

## 2023-01-19 DIAGNOSIS — S0990XA Unspecified injury of head, initial encounter: Secondary | ICD-10-CM | POA: Diagnosis present

## 2023-01-19 DIAGNOSIS — S60512A Abrasion of left hand, initial encounter: Secondary | ICD-10-CM | POA: Insufficient documentation

## 2023-01-19 DIAGNOSIS — S0181XA Laceration without foreign body of other part of head, initial encounter: Secondary | ICD-10-CM | POA: Diagnosis not present

## 2023-01-19 DIAGNOSIS — G8929 Other chronic pain: Secondary | ICD-10-CM | POA: Insufficient documentation

## 2023-01-19 DIAGNOSIS — S60511A Abrasion of right hand, initial encounter: Secondary | ICD-10-CM | POA: Insufficient documentation

## 2023-01-19 DIAGNOSIS — M25562 Pain in left knee: Secondary | ICD-10-CM | POA: Diagnosis not present

## 2023-01-19 DIAGNOSIS — T07XXXA Unspecified multiple injuries, initial encounter: Secondary | ICD-10-CM

## 2023-01-19 LAB — CBC WITH DIFFERENTIAL/PLATELET
Abs Immature Granulocytes: 0.03 10*3/uL (ref 0.00–0.07)
Basophils Absolute: 0.1 10*3/uL (ref 0.0–0.1)
Basophils Relative: 1 %
Eosinophils Absolute: 0.2 10*3/uL (ref 0.0–0.5)
Eosinophils Relative: 3 %
HCT: 46.5 % (ref 39.0–52.0)
Hemoglobin: 15.1 g/dL (ref 13.0–17.0)
Immature Granulocytes: 1 %
Lymphocytes Relative: 35 %
Lymphs Abs: 1.5 10*3/uL (ref 0.7–4.0)
MCH: 33.3 pg (ref 26.0–34.0)
MCHC: 32.5 g/dL (ref 30.0–36.0)
MCV: 102.4 fL — ABNORMAL HIGH (ref 80.0–100.0)
Monocytes Absolute: 0.4 10*3/uL (ref 0.1–1.0)
Monocytes Relative: 10 %
Neutro Abs: 2.2 10*3/uL (ref 1.7–7.7)
Neutrophils Relative %: 50 %
Platelets: 236 10*3/uL (ref 150–400)
RBC: 4.54 MIL/uL (ref 4.22–5.81)
RDW: 12.3 % (ref 11.5–15.5)
WBC: 4.4 10*3/uL (ref 4.0–10.5)
nRBC: 0 % (ref 0.0–0.2)

## 2023-01-19 LAB — COMPREHENSIVE METABOLIC PANEL
ALT: 72 U/L — ABNORMAL HIGH (ref 0–44)
AST: 63 U/L — ABNORMAL HIGH (ref 15–41)
Albumin: 4.6 g/dL (ref 3.5–5.0)
Alkaline Phosphatase: 56 U/L (ref 38–126)
Anion gap: 11 (ref 5–15)
BUN: 10 mg/dL (ref 6–20)
CO2: 24 mmol/L (ref 22–32)
Calcium: 8.7 mg/dL — ABNORMAL LOW (ref 8.9–10.3)
Chloride: 105 mmol/L (ref 98–111)
Creatinine, Ser: 0.71 mg/dL (ref 0.61–1.24)
GFR, Estimated: >60 mL/min (ref >60)
Glucose, Bld: 95 mg/dL (ref 70–99)
Potassium: 3.7 mmol/L (ref 3.5–5.1)
Sodium: 140 mmol/L (ref 135–145)
Total Bilirubin: 0.5 mg/dL (ref 0.3–1.2)
Total Protein: 8.4 g/dL — ABNORMAL HIGH (ref 6.5–8.1)

## 2023-01-19 LAB — ETHANOL: Alcohol, Ethyl (B): 235 mg/dL — ABNORMAL HIGH (ref <10)

## 2023-01-19 MED ORDER — KETOROLAC TROMETHAMINE 15 MG/ML IJ SOLN
15.0000 mg | Freq: Once | INTRAMUSCULAR | Status: AC
  Administered 2023-01-19: 15 mg via INTRAMUSCULAR
  Filled 2023-01-19: qty 1

## 2023-01-19 NOTE — ED Notes (Signed)
Pt was ambulatory to the restroom w/o complaints.
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
## 2023-01-19 NOTE — Discharge Instructions (Addendum)
Please make sure you are cleaning your wounds daily with Dial soap and water. Your imaging was unremarkable. You may experience some aches and pains over the next few days. I recommended 1000mg  of Tylenol and/or 600mg  of ibuprofen every 6 hours as needed for pain. I have given you the information for [facility] for you to call to follow up with. If you have any concerns, new or worsening symptoms, please return to the nearest ER for re-evaluation.   Contact a doctor if: These symptoms do not go away: Headaches. Dizziness. Double vision or vision changes. Trouble sleeping. Changes in mood. You have new symptoms. Get help right away if: You have sudden: Headache that is very bad. Vomiting that does not stop. Changes in the size of one of your pupils. Pupils are the black centers of your eyes. Changes in how you see (vision). More confusion or more grumpy moods. You have a seizure. Your symptoms get worse. You have a clear or bloody fluid coming from your nose or ears. These symptoms may be an emergency. Get help right away. Call 911. Do not wait to see if the symptoms will go away. Do not drive yourself to the hospital.

## 2023-03-02 ENCOUNTER — Emergency Department (HOSPITAL_COMMUNITY): Payer: 59

## 2023-03-02 ENCOUNTER — Other Ambulatory Visit: Payer: Self-pay

## 2023-03-02 ENCOUNTER — Encounter (HOSPITAL_COMMUNITY): Payer: Self-pay

## 2023-03-02 ENCOUNTER — Emergency Department (HOSPITAL_COMMUNITY)
Admission: EM | Admit: 2023-03-02 | Discharge: 2023-03-02 | Disposition: A | Discharge status: Home / Self Care | Payer: 59 | Attending: Emergency Medicine | Admitting: Emergency Medicine

## 2023-03-02 DIAGNOSIS — S1081XA Abrasion of other specified part of neck, initial encounter: Secondary | ICD-10-CM | POA: Diagnosis not present

## 2023-03-02 DIAGNOSIS — R0789 Other chest pain: Secondary | ICD-10-CM

## 2023-03-02 DIAGNOSIS — Y908 Blood alcohol level of 240 mg/100 ml or more: Secondary | ICD-10-CM | POA: Insufficient documentation

## 2023-03-02 DIAGNOSIS — F10920 Alcohol use, unspecified with intoxication, uncomplicated: Secondary | ICD-10-CM | POA: Insufficient documentation

## 2023-03-02 DIAGNOSIS — S40212A Abrasion of left shoulder, initial encounter: Secondary | ICD-10-CM | POA: Insufficient documentation

## 2023-03-02 LAB — CBC
HCT: 42 % (ref 39.0–52.0)
Hemoglobin: 13.8 g/dL (ref 13.0–17.0)
MCH: 32.9 pg (ref 26.0–34.0)
MCHC: 32.9 g/dL (ref 30.0–36.0)
MCV: 100 fL (ref 80.0–100.0)
Platelets: 260 10*3/uL (ref 150–400)
RBC: 4.2 MIL/uL — ABNORMAL LOW (ref 4.22–5.81)
RDW: 12.8 % (ref 11.5–15.5)
WBC: 3.9 10*3/uL — ABNORMAL LOW (ref 4.0–10.5)
nRBC: 0 % (ref 0.0–0.2)

## 2023-03-02 LAB — BASIC METABOLIC PANEL
Anion gap: 12 (ref 5–15)
BUN: 8 mg/dL (ref 6–20)
CO2: 19 mmol/L — ABNORMAL LOW (ref 22–32)
Calcium: 9 mg/dL (ref 8.9–10.3)
Chloride: 106 mmol/L (ref 98–111)
Creatinine, Ser: 0.82 mg/dL (ref 0.61–1.24)
GFR, Estimated: >60 mL/min (ref >60)
Glucose, Bld: 104 mg/dL — ABNORMAL HIGH (ref 70–99)
Potassium: 3.9 mmol/L (ref 3.5–5.1)
Sodium: 137 mmol/L (ref 135–145)

## 2023-03-02 LAB — TROPONIN I (HIGH SENSITIVITY): Troponin I (High Sensitivity): 6 ng/L (ref <18)

## 2023-03-02 LAB — ETHANOL: Alcohol, Ethyl (B): 347 mg/dL (ref <10)
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
## 2023-03-02 NOTE — ED Notes (Signed)
Patient transported to CT 
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
## 2023-03-02 NOTE — Discharge Instructions (Addendum)
Please return to the ER if you have any chest pain, shortness of breath, severe headache, inability to walk, or any other symptoms you are concerned about.

## 2023-03-13 ENCOUNTER — Other Ambulatory Visit: Payer: Self-pay

## 2023-03-13 ENCOUNTER — Emergency Department (HOSPITAL_COMMUNITY)
Admission: EM | Admit: 2023-03-13 | Discharge: 2023-03-14 | Disposition: A | Discharge status: Home / Self Care | Payer: 59 | Attending: Emergency Medicine | Admitting: Emergency Medicine

## 2023-03-13 ENCOUNTER — Encounter (HOSPITAL_COMMUNITY): Payer: Self-pay

## 2023-03-13 ENCOUNTER — Emergency Department (HOSPITAL_COMMUNITY): Payer: 59

## 2023-03-13 DIAGNOSIS — S0012XA Contusion of left eyelid and periocular area, initial encounter: Secondary | ICD-10-CM | POA: Insufficient documentation

## 2023-03-13 DIAGNOSIS — Z79899 Other long term (current) drug therapy: Secondary | ICD-10-CM | POA: Insufficient documentation

## 2023-03-13 DIAGNOSIS — R930 Abnormal findings on diagnostic imaging of skull and head, not elsewhere classified: Secondary | ICD-10-CM | POA: Diagnosis not present

## 2023-03-13 DIAGNOSIS — S0592XA Unspecified injury of left eye and orbit, initial encounter: Secondary | ICD-10-CM | POA: Diagnosis present

## 2023-03-13 MED ORDER — HYDROCODONE-ACETAMINOPHEN 5-325 MG PO TABS
1.0000 | ORAL_TABLET | Freq: Once | ORAL | Status: AC
  Administered 2023-03-13: 1 via ORAL
  Filled 2023-03-13: qty 1
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
## 2023-03-13 NOTE — ED Notes (Signed)
Pt stated he was having trouble breathing, o2 was checked and reading at 98 RA, triage nurse notified and responded.

## 2023-03-13 NOTE — ED Notes (Signed)
Pt called x3 both by NT and Registration, no answer

## 2023-03-13 NOTE — ED Notes (Signed)
Pt was in ct, moved back to waiting room

## 2023-03-14 MED ORDER — IBUPROFEN 400 MG PO TABS
600.0000 mg | ORAL_TABLET | Freq: Once | ORAL | Status: AC
  Administered 2023-03-14: 600 mg via ORAL
  Filled 2023-03-14: qty 1

## 2023-03-14 NOTE — Discharge Instructions (Signed)
Your workup tonight was reassuring with no sign of fracture, intracranial abnormality, or dislocation.  Please take ibuprofen and acetaminophen as needed at home for pain control.  If you develop life-threatening symptoms return to the emergency department.
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
## 2023-06-10 ENCOUNTER — Encounter (HOSPITAL_COMMUNITY): Payer: Self-pay

## 2023-06-10 ENCOUNTER — Emergency Department (HOSPITAL_COMMUNITY)

## 2023-06-10 ENCOUNTER — Emergency Department (HOSPITAL_COMMUNITY)
Admission: EM | Admit: 2023-06-10 | Discharge: 2023-06-10 | Disposition: A | Discharge status: Home / Self Care | Attending: Emergency Medicine | Admitting: Emergency Medicine

## 2023-06-10 DIAGNOSIS — M542 Cervicalgia: Secondary | ICD-10-CM | POA: Insufficient documentation

## 2023-06-10 DIAGNOSIS — R519 Headache, unspecified: Secondary | ICD-10-CM | POA: Diagnosis present

## 2023-06-10 DIAGNOSIS — Y907 Blood alcohol level of 200-239 mg/100 ml: Secondary | ICD-10-CM | POA: Diagnosis not present

## 2023-06-10 DIAGNOSIS — F10129 Alcohol abuse with intoxication, unspecified: Secondary | ICD-10-CM | POA: Insufficient documentation

## 2023-06-10 DIAGNOSIS — R109 Unspecified abdominal pain: Secondary | ICD-10-CM | POA: Insufficient documentation

## 2023-06-10 DIAGNOSIS — R0789 Other chest pain: Secondary | ICD-10-CM | POA: Insufficient documentation

## 2023-06-10 DIAGNOSIS — F10929 Alcohol use, unspecified with intoxication, unspecified: Secondary | ICD-10-CM

## 2023-06-10 DIAGNOSIS — R7401 Elevation of levels of liver transaminase levels: Secondary | ICD-10-CM | POA: Diagnosis not present

## 2023-06-10 DIAGNOSIS — Y92410 Unspecified street and highway as the place of occurrence of the external cause: Secondary | ICD-10-CM | POA: Insufficient documentation

## 2023-06-10 LAB — COMPREHENSIVE METABOLIC PANEL
ALT: 90 U/L — ABNORMAL HIGH (ref 0–44)
AST: 97 U/L — ABNORMAL HIGH (ref 15–41)
Albumin: 4.1 g/dL (ref 3.5–5.0)
Alkaline Phosphatase: 41 U/L (ref 38–126)
Anion gap: 17 — ABNORMAL HIGH (ref 5–15)
BUN: 10 mg/dL (ref 6–20)
CO2: 23 mmol/L (ref 22–32)
Calcium: 9.3 mg/dL (ref 8.9–10.3)
Chloride: 100 mmol/L (ref 98–111)
Creatinine, Ser: 0.77 mg/dL (ref 0.61–1.24)
GFR, Estimated: >60 mL/min (ref >60)
Glucose, Bld: 134 mg/dL — ABNORMAL HIGH (ref 70–99)
Potassium: 3.5 mmol/L (ref 3.5–5.1)
Sodium: 140 mmol/L (ref 135–145)
Total Bilirubin: 0.5 mg/dL (ref 0.0–1.2)
Total Protein: 7.4 g/dL (ref 6.5–8.1)

## 2023-06-10 LAB — CBC
HCT: 42.9 % (ref 39.0–52.0)
Hemoglobin: 14 g/dL (ref 13.0–17.0)
MCH: 33 pg (ref 26.0–34.0)
MCHC: 32.6 g/dL (ref 30.0–36.0)
MCV: 101.2 fL — ABNORMAL HIGH (ref 80.0–100.0)
Platelets: 171 10*3/uL (ref 150–400)
RBC: 4.24 MIL/uL (ref 4.22–5.81)
RDW: 12.3 % (ref 11.5–15.5)
WBC: 2.9 10*3/uL — ABNORMAL LOW (ref 4.0–10.5)
nRBC: 0 % (ref 0.0–0.2)

## 2023-06-10 LAB — I-STAT CHEM 8, ED
BUN: 10 mg/dL (ref 6–20)
Calcium, Ion: 1.03 mmol/L — ABNORMAL LOW (ref 1.15–1.40)
Chloride: 103 mmol/L (ref 98–111)
Creatinine, Ser: 1 mg/dL (ref 0.61–1.24)
Glucose, Bld: 135 mg/dL — ABNORMAL HIGH (ref 70–99)
HCT: 45 % (ref 39.0–52.0)
Hemoglobin: 15.3 g/dL (ref 13.0–17.0)
Potassium: 3.5 mmol/L (ref 3.5–5.1)
Sodium: 140 mmol/L (ref 135–145)
TCO2: 24 mmol/L (ref 22–32)

## 2023-06-10 LAB — PROTIME-INR
INR: 0.9 (ref 0.8–1.2)
Prothrombin Time: 12.8 s (ref 11.4–15.2)

## 2023-06-10 LAB — ETHANOL: Alcohol, Ethyl (B): 236 mg/dL — ABNORMAL HIGH (ref <10)

## 2023-06-10 LAB — I-STAT CG4 LACTIC ACID, ED: Lactic Acid, Venous: 1.6 mmol/L (ref 0.5–1.9)

## 2023-06-10 MED ORDER — HYDROMORPHONE HCL 1 MG/ML IJ SOLN
1.0000 mg | Freq: Once | INTRAMUSCULAR | Status: AC
  Administered 2023-06-10: 1 mg via INTRAVENOUS
  Filled 2023-06-10: qty 1

## 2023-06-10 MED ORDER — MORPHINE SULFATE (PF) 4 MG/ML IV SOLN: 4.0000 mg | Freq: Once | INTRAVENOUS | Status: DC

## 2023-06-10 MED ORDER — IBUPROFEN 600 MG PO TABS
600.0000 mg | ORAL_TABLET | Freq: Four times a day (QID) | ORAL | 0 refills | Status: DC | PRN
Start: 2023-06-10 — End: 2023-07-22

## 2023-06-10 MED ORDER — CYCLOBENZAPRINE HCL 10 MG PO TABS: 10.0000 mg | ORAL_TABLET | Freq: Two times a day (BID) | ORAL | 0 refills | Status: DC | PRN

## 2023-06-10 MED ORDER — LACTATED RINGERS IV BOLUS
1000.0000 mL | Freq: Once | INTRAVENOUS | Status: AC
  Administered 2023-06-10: 1000 mL via INTRAVENOUS

## 2023-06-10 MED ORDER — ONDANSETRON HCL 4 MG/2ML IJ SOLN
4.0000 mg | Freq: Once | INTRAMUSCULAR | Status: AC
  Administered 2023-06-10: 4 mg via INTRAVENOUS
  Filled 2023-06-10: qty 2

## 2023-06-10 MED ORDER — ONDANSETRON HCL 4 MG/2ML IJ SOLN: 4.0000 mg | Freq: Once | INTRAMUSCULAR | Status: DC

## 2023-06-10 MED ORDER — IOHEXOL 350 MG/ML SOLN
75.0000 mL | Freq: Once | INTRAVENOUS | Status: AC | PRN
  Administered 2023-06-10: 75 mL via INTRAVENOUS

## 2023-06-10 MED ORDER — LACTATED RINGERS IV BOLUS: 1000.0000 mL | Freq: Once | INTRAVENOUS | Status: DC
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
## 2023-07-01 ENCOUNTER — Other Ambulatory Visit: Payer: Self-pay

## 2023-07-01 ENCOUNTER — Emergency Department (HOSPITAL_COMMUNITY)
Admission: EM | Admit: 2023-07-01 | Discharge: 2023-07-01 | Discharge status: Against Medical Advice | Attending: Emergency Medicine | Admitting: Emergency Medicine

## 2023-07-01 DIAGNOSIS — M549 Dorsalgia, unspecified: Secondary | ICD-10-CM | POA: Insufficient documentation

## 2023-07-01 DIAGNOSIS — M791 Myalgia, unspecified site: Secondary | ICD-10-CM | POA: Diagnosis not present

## 2023-07-01 DIAGNOSIS — Z5321 Procedure and treatment not carried out due to patient leaving prior to being seen by health care provider: Secondary | ICD-10-CM | POA: Diagnosis not present

## 2023-07-01 DIAGNOSIS — R519 Headache, unspecified: Secondary | ICD-10-CM | POA: Diagnosis not present

## 2023-07-01 DIAGNOSIS — M25552 Pain in left hip: Secondary | ICD-10-CM | POA: Insufficient documentation
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
## 2023-07-11 ENCOUNTER — Emergency Department (HOSPITAL_COMMUNITY)

## 2023-07-11 ENCOUNTER — Encounter (HOSPITAL_COMMUNITY): Payer: Self-pay

## 2023-07-11 ENCOUNTER — Other Ambulatory Visit: Payer: Self-pay

## 2023-07-11 ENCOUNTER — Emergency Department (HOSPITAL_COMMUNITY)
Admission: EM | Admit: 2023-07-11 | Discharge: 2023-07-11 | Disposition: A | Discharge status: Home / Self Care | Attending: Emergency Medicine | Admitting: Emergency Medicine

## 2023-07-11 DIAGNOSIS — Z0471 Encounter for examination and observation following alleged adult physical abuse: Secondary | ICD-10-CM | POA: Insufficient documentation

## 2023-07-11 DIAGNOSIS — K089 Disorder of teeth and supporting structures, unspecified: Secondary | ICD-10-CM

## 2023-07-11 DIAGNOSIS — Y908 Blood alcohol level of 240 mg/100 ml or more: Secondary | ICD-10-CM | POA: Diagnosis not present

## 2023-07-11 LAB — BASIC METABOLIC PANEL WITH GFR
Anion gap: 14 (ref 5–15)
BUN: 7 mg/dL (ref 6–20)
CO2: 25 mmol/L (ref 22–32)
Calcium: 9 mg/dL (ref 8.9–10.3)
Chloride: 105 mmol/L (ref 98–111)
Creatinine, Ser: 0.68 mg/dL (ref 0.61–1.24)
GFR, Estimated: >60 mL/min (ref >60)
Glucose, Bld: 107 mg/dL — ABNORMAL HIGH (ref 70–99)
Potassium: 3.6 mmol/L (ref 3.5–5.1)
Sodium: 144 mmol/L (ref 135–145)

## 2023-07-11 LAB — CBC
HCT: 44.5 % (ref 39.0–52.0)
Hemoglobin: 14.5 g/dL (ref 13.0–17.0)
MCH: 32.7 pg (ref 26.0–34.0)
MCHC: 32.6 g/dL (ref 30.0–36.0)
MCV: 100.2 fL — ABNORMAL HIGH (ref 80.0–100.0)
Platelets: 175 10*3/uL (ref 150–400)
RBC: 4.44 MIL/uL (ref 4.22–5.81)
RDW: 12.3 % (ref 11.5–15.5)
WBC: 3.2 10*3/uL — ABNORMAL LOW (ref 4.0–10.5)
nRBC: 0 % (ref 0.0–0.2)

## 2023-07-11 LAB — ETHANOL: Alcohol, Ethyl (B): 291 mg/dL — ABNORMAL HIGH (ref <10)

## 2023-07-11 MED ORDER — ACETAMINOPHEN 500 MG PO TABS
1000.0000 mg | ORAL_TABLET | Freq: Once | ORAL | Status: AC
  Administered 2023-07-11: 1000 mg via ORAL
  Filled 2023-07-11: qty 2

## 2023-07-11 MED ORDER — AMOXICILLIN-POT CLAVULANATE 875-125 MG PO TABS: 1.0000 | ORAL_TABLET | Freq: Two times a day (BID) | ORAL | 0 refills | Status: AC
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
## 2023-07-22 ENCOUNTER — Encounter (HOSPITAL_COMMUNITY): Payer: Self-pay

## 2023-07-22 ENCOUNTER — Emergency Department (HOSPITAL_COMMUNITY)

## 2023-07-22 ENCOUNTER — Other Ambulatory Visit: Payer: Self-pay

## 2023-07-22 ENCOUNTER — Emergency Department (HOSPITAL_COMMUNITY)
Admission: EM | Admit: 2023-07-22 | Discharge: 2023-07-22 | Disposition: A | Discharge status: Home / Self Care | Attending: Emergency Medicine | Admitting: Emergency Medicine

## 2023-07-22 DIAGNOSIS — R519 Headache, unspecified: Secondary | ICD-10-CM | POA: Diagnosis present

## 2023-07-22 DIAGNOSIS — R0789 Other chest pain: Secondary | ICD-10-CM | POA: Insufficient documentation

## 2023-07-22 DIAGNOSIS — Y9367 Activity, basketball: Secondary | ICD-10-CM | POA: Insufficient documentation

## 2023-07-22 DIAGNOSIS — F17210 Nicotine dependence, cigarettes, uncomplicated: Secondary | ICD-10-CM | POA: Insufficient documentation

## 2023-07-22 DIAGNOSIS — S0990XA Unspecified injury of head, initial encounter: Secondary | ICD-10-CM

## 2023-07-22 MED ORDER — ACETAMINOPHEN 500 MG PO TABS
1000.0000 mg | ORAL_TABLET | Freq: Once | ORAL | Status: AC
  Administered 2023-07-22: 1000 mg via ORAL
  Filled 2023-07-22: qty 2

## 2023-07-22 MED ORDER — IBUPROFEN 600 MG PO TABS: 600.0000 mg | ORAL_TABLET | Freq: Four times a day (QID) | ORAL | 0 refills | Status: AC | PRN

## 2023-07-22 MED ORDER — CYCLOBENZAPRINE HCL 10 MG PO TABS: 10.0000 mg | ORAL_TABLET | Freq: Two times a day (BID) | ORAL | 0 refills | Status: AC | PRN

## 2023-07-22 MED ORDER — ACETAMINOPHEN 325 MG PO TABS: 650.0000 mg | ORAL_TABLET | Freq: Four times a day (QID) | ORAL | 0 refills | Status: AC | PRN

## 2023-07-22 MED ORDER — DROPERIDOL 2.5 MG/ML IJ SOLN: 2.5000 mg | Freq: Once | INTRAMUSCULAR | Status: DC

## 2023-07-22 NOTE — ED Notes (Signed)
 Pt resting comfortably. Call bell within reach. Denies any additional needs
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
